# Patient Record
Sex: Female | Born: 1990 | Race: White | Hispanic: No | Marital: Single | Smoking: Never smoker
Health system: Southern US, Community
[De-identification: ages and names within clinical notes are randomized; demographics above are authoritative.]

## PROBLEM LIST (undated history)

## (undated) DIAGNOSIS — F79 Unspecified intellectual disabilities: Secondary | ICD-10-CM

## (undated) DIAGNOSIS — F319 Bipolar disorder, unspecified: Secondary | ICD-10-CM

## (undated) DIAGNOSIS — Q8719 Other congenital malformation syndromes predominantly associated with short stature: Secondary | ICD-10-CM

## (undated) HISTORY — DX: Other congenital malformation syndromes predominantly associated with short stature: Q87.19

---
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
## 2008-12-15 ENCOUNTER — Emergency Department (HOSPITAL_COMMUNITY): Admission: EM | Admit: 2008-12-15 | Discharge: 2008-12-15 | Payer: Self-pay | Admitting: Emergency Medicine

## 2008-12-22 ENCOUNTER — Inpatient Hospital Stay (HOSPITAL_COMMUNITY): Admission: EM | Admit: 2008-12-22 | Discharge: 2008-12-24 | Payer: Self-pay | Admitting: Emergency Medicine

## 2008-12-24 ENCOUNTER — Ambulatory Visit: Payer: Self-pay | Admitting: *Deleted

## 2009-10-05 ENCOUNTER — Emergency Department (HOSPITAL_COMMUNITY): Admission: EM | Admit: 2009-10-05 | Discharge: 2009-10-12 | Payer: Self-pay | Admitting: Emergency Medicine

## 2009-10-06 ENCOUNTER — Ambulatory Visit: Payer: Self-pay | Admitting: Psychiatry

## 2009-10-10 ENCOUNTER — Ambulatory Visit: Payer: Self-pay | Admitting: Psychiatry

## 2009-10-11 ENCOUNTER — Ambulatory Visit: Payer: Self-pay | Admitting: Psychiatry

## 2010-03-26 ENCOUNTER — Emergency Department (HOSPITAL_COMMUNITY): Admission: EM | Admit: 2010-03-26 | Discharge: 2010-03-26 | Payer: Self-pay | Admitting: Emergency Medicine

## 2010-08-05 LAB — URINALYSIS, ROUTINE W REFLEX MICROSCOPIC
Glucose, UA: NEGATIVE mg/dL
Hgb urine dipstick: NEGATIVE
Ketones, ur: NEGATIVE mg/dL
Nitrite: NEGATIVE
Protein, ur: NEGATIVE mg/dL
Specific Gravity, Urine: 1.028 (ref 1.005–1.030)
Urobilinogen, UA: 0.2 mg/dL (ref 0.0–1.0)
pH: 6 (ref 5.0–8.0)

## 2010-08-05 LAB — DIFFERENTIAL
Eosinophils Absolute: 0 10*3/uL (ref 0.0–0.7)
Lymphs Abs: 2.4 10*3/uL (ref 0.7–4.0)
Monocytes Absolute: 0.7 10*3/uL (ref 0.1–1.0)
Monocytes Relative: 9 % (ref 3–12)
Neutrophils Relative %: 56 % (ref 43–77)

## 2010-08-05 LAB — URINE MICROSCOPIC-ADD ON

## 2010-08-05 LAB — BASIC METABOLIC PANEL
CO2: 21 mEq/L (ref 19–32)
Chloride: 111 mEq/L (ref 96–112)
Creatinine, Ser: 0.89 mg/dL (ref 0.4–1.2)
GFR calc Af Amer: >60 mL/min (ref >60)
Potassium: 2.8 mEq/L — ABNORMAL LOW (ref 3.5–5.1)
Sodium: 141 mEq/L (ref 135–145)

## 2010-08-05 LAB — POCT I-STAT, CHEM 8
Calcium, Ion: 1.14 mmol/L (ref 1.12–1.32)
Creatinine, Ser: 0.7 mg/dL (ref 0.4–1.2)
Glucose, Bld: 101 mg/dL — ABNORMAL HIGH (ref 70–99)
Hemoglobin: 12.9 g/dL (ref 12.0–15.0)
Potassium: 3.4 mEq/L — ABNORMAL LOW (ref 3.5–5.1)

## 2010-08-05 LAB — CBC
HCT: 37.8 % (ref 36.0–46.0)
Hemoglobin: 12.9 g/dL (ref 12.0–15.0)
MCV: 88.7 fL (ref 78.0–100.0)
RBC: 4.27 MIL/uL (ref 3.87–5.11)
WBC: 7.3 10*3/uL (ref 4.0–10.5)

## 2010-08-05 LAB — URINE CULTURE: Colony Count: 60000

## 2010-08-05 LAB — RAPID URINE DRUG SCREEN, HOSP PERFORMED: Barbiturates: NOT DETECTED

## 2010-08-05 LAB — ETHANOL: Alcohol, Ethyl (B): <5 mg/dL (ref 0–10)

## 2010-08-24 LAB — DIFFERENTIAL
Lymphocytes Relative: 19 % (ref 12–46)
Lymphs Abs: 1.8 10*3/uL (ref 0.7–4.0)
Monocytes Relative: 9 % (ref 3–12)
Neutro Abs: 6.7 10*3/uL (ref 1.7–7.7)
Neutrophils Relative %: 71 % (ref 43–77)

## 2010-08-24 LAB — CBC
HCT: 38 % (ref 36.0–46.0)
Hemoglobin: 12.6 g/dL (ref 12.0–15.0)
Hemoglobin: 13.1 g/dL (ref 12.0–15.0)
MCHC: 33.2 g/dL (ref 30.0–36.0)
MCHC: 33.6 g/dL (ref 30.0–36.0)
MCV: 87.5 fL (ref 78.0–100.0)
RBC: 4.46 MIL/uL (ref 3.87–5.11)
RDW: 13.6 % (ref 11.5–15.5)

## 2010-08-24 LAB — COMPREHENSIVE METABOLIC PANEL
BUN: 8 mg/dL (ref 6–23)
Calcium: 9.3 mg/dL (ref 8.4–10.5)
Glucose, Bld: 106 mg/dL — ABNORMAL HIGH (ref 70–99)
Total Protein: 7.7 g/dL (ref 6.0–8.3)

## 2010-08-24 LAB — BASIC METABOLIC PANEL
BUN: 7 mg/dL (ref 6–23)
Chloride: 106 mEq/L (ref 96–112)
Glucose, Bld: 99 mg/dL (ref 70–99)
Potassium: 3.3 mEq/L — ABNORMAL LOW (ref 3.5–5.1)

## 2010-08-24 LAB — TSH: TSH: 2.042 u[IU]/mL (ref 0.700–6.400)

## 2010-08-25 LAB — CBC
MCHC: 33.5 g/dL (ref 30.0–36.0)
Platelets: 204 10*3/uL (ref 150–400)
RBC: 4.43 MIL/uL (ref 3.87–5.11)
RDW: 13.6 % (ref 11.5–15.5)

## 2010-08-25 LAB — DIFFERENTIAL
Basophils Absolute: 0 10*3/uL (ref 0.0–0.1)
Basophils Relative: 0 % (ref 0–1)
Monocytes Relative: 9 % (ref 3–12)
Neutro Abs: 4.7 10*3/uL (ref 1.7–7.7)
Neutrophils Relative %: 54 % (ref 43–77)

## 2010-08-25 LAB — RAPID URINE DRUG SCREEN, HOSP PERFORMED
Amphetamines: NOT DETECTED
Barbiturates: NOT DETECTED
Benzodiazepines: POSITIVE — AB

## 2010-08-25 LAB — BASIC METABOLIC PANEL
BUN: 11 mg/dL (ref 6–23)
CO2: 24 mEq/L (ref 19–32)
Calcium: 9.4 mg/dL (ref 8.4–10.5)
Creatinine, Ser: 0.89 mg/dL (ref 0.4–1.2)
GFR calc Af Amer: >60 mL/min (ref >60)
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
## 2013-01-31 ENCOUNTER — Encounter: Payer: Self-pay | Admitting: *Deleted

## 2013-02-04 ENCOUNTER — Encounter: Payer: Self-pay | Admitting: Obstetrics and Gynecology

## 2013-03-18 ENCOUNTER — Encounter: Payer: Self-pay | Admitting: Obstetrics & Gynecology

## 2013-04-07 ENCOUNTER — Encounter (HOSPITAL_BASED_OUTPATIENT_CLINIC_OR_DEPARTMENT_OTHER): Payer: Self-pay | Admitting: Emergency Medicine

## 2013-04-07 ENCOUNTER — Emergency Department (HOSPITAL_BASED_OUTPATIENT_CLINIC_OR_DEPARTMENT_OTHER)
Admission: EM | Admit: 2013-04-07 | Discharge: 2013-04-07 | Disposition: A | Discharge status: Home / Self Care | Payer: Medicaid Other | Attending: Emergency Medicine | Admitting: Emergency Medicine

## 2013-04-07 DIAGNOSIS — S300XXA Contusion of lower back and pelvis, initial encounter: Secondary | ICD-10-CM | POA: Insufficient documentation

## 2013-04-07 DIAGNOSIS — T148XXA Other injury of unspecified body region, initial encounter: Secondary | ICD-10-CM

## 2013-04-07 DIAGNOSIS — Z79899 Other long term (current) drug therapy: Secondary | ICD-10-CM | POA: Insufficient documentation

## 2013-04-07 NOTE — ED Notes (Signed)
SANE nurse, Jeris Penta, notified at [phone]. She is leaving [facility] at this time to come here.

## 2013-04-07 NOTE — ED Notes (Addendum)
[employer] [place] officer at bedside at this time.
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
## 2013-04-07 NOTE — ED Notes (Signed)
abd and back pain  Seen here for same x 11/2 weeks ago for same.  Prescribed keflex     No better

## 2013-04-07 NOTE — ED Provider Notes (Signed)
Medical screening examination/treatment/procedure(s) were performed by non-physician practitioner and as supervising physician I was immediately available for consultation/collaboration.  EKG Interpretation   None         Glynn Octave, MD 04/07/13 [phone]
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
## 2013-04-07 NOTE — ED Notes (Signed)
SANE nurse, Princella Ion at bedside.

## 2013-04-15 ENCOUNTER — Encounter (HOSPITAL_COMMUNITY): Payer: Self-pay | Admitting: Emergency Medicine

## 2013-04-15 ENCOUNTER — Emergency Department (HOSPITAL_COMMUNITY)
Admission: EM | Admit: 2013-04-15 | Discharge: 2013-04-17 | Disposition: A | Discharge status: Home / Self Care | Payer: Medicaid Other | Attending: Emergency Medicine | Admitting: Emergency Medicine

## 2013-04-15 DIAGNOSIS — F4310 Post-traumatic stress disorder, unspecified: Secondary | ICD-10-CM | POA: Diagnosis present

## 2013-04-15 DIAGNOSIS — F73 Profound intellectual disabilities: Secondary | ICD-10-CM

## 2013-04-15 DIAGNOSIS — F79 Unspecified intellectual disabilities: Secondary | ICD-10-CM | POA: Insufficient documentation

## 2013-04-15 DIAGNOSIS — R4689 Other symptoms and signs involving appearance and behavior: Secondary | ICD-10-CM | POA: Diagnosis present

## 2013-04-15 DIAGNOSIS — F639 Impulse disorder, unspecified: Secondary | ICD-10-CM | POA: Insufficient documentation

## 2013-04-15 DIAGNOSIS — R454 Irritability and anger: Secondary | ICD-10-CM | POA: Diagnosis present

## 2013-04-15 DIAGNOSIS — Z79899 Other long term (current) drug therapy: Secondary | ICD-10-CM | POA: Insufficient documentation

## 2013-04-15 DIAGNOSIS — Z3202 Encounter for pregnancy test, result negative: Secondary | ICD-10-CM | POA: Insufficient documentation

## 2013-04-15 HISTORY — DX: Unspecified intellectual disabilities: F79

## 2013-04-15 LAB — CBC WITH DIFFERENTIAL/PLATELET
Basophils Absolute: 0 10*3/uL (ref 0.0–0.1)
Basophils Relative: 0 % (ref 0–1)
Hemoglobin: 13.4 g/dL (ref 12.0–15.0)
Lymphocytes Relative: 30 % (ref 12–46)
Lymphs Abs: 3.1 10*3/uL (ref 0.7–4.0)
MCH: 31.7 pg (ref 26.0–34.0)
MCHC: 34.1 g/dL (ref 30.0–36.0)
Monocytes Absolute: 0.9 10*3/uL (ref 0.1–1.0)
Neutro Abs: 6.1 10*3/uL (ref 1.7–7.7)
Neutrophils Relative %: 60 % (ref 43–77)
Platelets: 225 10*3/uL (ref 150–400)
RDW: 12.5 % (ref 11.5–15.5)
WBC: 10.2 10*3/uL (ref 4.0–10.5)

## 2013-04-15 LAB — BASIC METABOLIC PANEL
BUN: 14 mg/dL (ref 6–23)
CO2: 22 mEq/L (ref 19–32)
Chloride: 102 mEq/L (ref 96–112)
Creatinine, Ser: 0.68 mg/dL (ref 0.50–1.10)
GFR calc Af Amer: >90 mL/min (ref >90)
Sodium: 137 mEq/L (ref 135–145)

## 2013-04-15 LAB — RAPID URINE DRUG SCREEN, HOSP PERFORMED
Amphetamines: NOT DETECTED
Barbiturates: NOT DETECTED
Benzodiazepines: NOT DETECTED
Cocaine: NOT DETECTED
Tetrahydrocannabinol: NOT DETECTED

## 2013-04-15 LAB — POCT PREGNANCY, URINE: Preg Test, Ur: NEGATIVE

## 2013-04-15 MED ORDER — IBUPROFEN 200 MG PO TABS: 600.0000 mg | ORAL_TABLET | Freq: Three times a day (TID) | ORAL | Status: DC | PRN

## 2013-04-15 MED ORDER — LAMOTRIGINE 200 MG PO TABS
200.0000 mg | ORAL_TABLET | Freq: Every day | ORAL | Status: DC
  Administered 2013-04-16 – 2013-04-17 (×2): 200 mg via ORAL
  Filled 2013-04-15 (×2): qty 1

## 2013-04-15 MED ORDER — PANTOPRAZOLE SODIUM 40 MG PO TBEC
40.0000 mg | DELAYED_RELEASE_TABLET | Freq: Every day | ORAL | Status: DC
  Administered 2013-04-15 – 2013-04-17 (×3): 40 mg via ORAL
  Filled 2013-04-15 (×3): qty 1

## 2013-04-15 MED ORDER — ONDANSETRON HCL 4 MG PO TABS: 4.0000 mg | ORAL_TABLET | Freq: Three times a day (TID) | ORAL | Status: DC | PRN

## 2013-04-15 MED ORDER — TRAZODONE HCL 50 MG PO TABS: 50.0000 mg | ORAL_TABLET | Freq: Two times a day (BID) | ORAL | Status: DC | PRN

## 2013-04-15 MED ORDER — OLANZAPINE 10 MG PO TABS
20.0000 mg | ORAL_TABLET | Freq: Two times a day (BID) | ORAL | Status: DC
  Administered 2013-04-15 – 2013-04-17 (×5): 20 mg via ORAL
  Filled 2013-04-15 (×5): qty 2

## 2013-04-15 MED ORDER — ACETAMINOPHEN 325 MG PO TABS: 650.0000 mg | ORAL_TABLET | ORAL | Status: DC | PRN

## 2013-04-15 MED ORDER — TRAZODONE HCL 100 MG PO TABS
100.0000 mg | ORAL_TABLET | Freq: Every day | ORAL | Status: DC
  Administered 2013-04-15 – 2013-04-16 (×2): 100 mg via ORAL
  Filled 2013-04-15 (×2): qty 1

## 2013-04-15 MED ORDER — DIVALPROEX SODIUM 500 MG PO DR TAB
500.0000 mg | DELAYED_RELEASE_TABLET | Freq: Every day | ORAL | Status: DC
  Administered 2013-04-15 – 2013-04-16 (×2): 500 mg via ORAL
  Filled 2013-04-15 (×2): qty 1

## 2013-04-15 MED ORDER — SERTRALINE HCL 50 MG PO TABS
200.0000 mg | ORAL_TABLET | Freq: Every day | ORAL | Status: DC
  Administered 2013-04-15 – 2013-04-17 (×3): 200 mg via ORAL
  Filled 2013-04-15 (×3): qty 4

## 2013-04-15 MED ORDER — LORATADINE 10 MG PO TABS
10.0000 mg | ORAL_TABLET | Freq: Every day | ORAL | Status: DC
  Administered 2013-04-16 – 2013-04-17 (×2): 10 mg via ORAL
  Filled 2013-04-15 (×2): qty 1
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
## 2013-04-15 NOTE — ED Notes (Signed)
Pt belongings: sports bra, striped shirt, black pants, black boots, white coat, socks in locker 28.

## 2013-04-15 NOTE — ED Notes (Signed)
Patient easily agitated if you get in her personal space

## 2013-04-15 NOTE — BH Assessment (Signed)
TA requested on this patient. Writer contacted SW-Shenita to inform her that this patient needed a TA. Shenita sts that she will complete a face to face assessment with this patient. Informed Shenita that TTS is available if assistance is needed.

## 2013-04-15 NOTE — ED Notes (Signed)
Bed: WA15 Expected date:  Expected time:  Means of arrival:  Comments: Hold for triage 4 
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
## 2013-04-16 DIAGNOSIS — F603 Borderline personality disorder: Secondary | ICD-10-CM

## 2013-04-16 DIAGNOSIS — R4689 Other symptoms and signs involving appearance and behavior: Secondary | ICD-10-CM | POA: Diagnosis present

## 2013-04-16 DIAGNOSIS — F79 Unspecified intellectual disabilities: Secondary | ICD-10-CM | POA: Diagnosis present

## 2013-04-16 DIAGNOSIS — R454 Irritability and anger: Secondary | ICD-10-CM

## 2013-04-16 DIAGNOSIS — F73 Profound intellectual disabilities: Secondary | ICD-10-CM

## 2013-04-16 DIAGNOSIS — F639 Impulse disorder, unspecified: Secondary | ICD-10-CM

## 2013-04-16 DIAGNOSIS — F4310 Post-traumatic stress disorder, unspecified: Secondary | ICD-10-CM

## 2013-04-16 LAB — VALPROIC ACID LEVEL: Valproic Acid Lvl: 36.2 ug/mL — ABNORMAL LOW (ref 50.0–100.0)

## 2013-04-16 LAB — HEPATIC FUNCTION PANEL
AST: 21 U/L (ref 0–37)
Albumin: 3.8 g/dL (ref 3.5–5.2)
Bilirubin, Direct: <0.1 mg/dL (ref 0.0–0.3)
Total Bilirubin: 0.3 mg/dL (ref 0.3–1.2)

## 2013-04-16 NOTE — ED Notes (Signed)
Pt mother at bedside
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
## 2013-04-16 NOTE — Progress Notes (Addendum)
Per MD and CSW assessments, pt appropriate for inpatient psych stay at hospital that can meet pt's MR needs [facility], [facility], etc.).   Per TTS Bobby, TTS [facility] can begin search for appropriate hospitals. We need 3 declines before we can work on a [place]/[facility] application. CSW available for consult and assistance. CSW working with family to get documentation to hospital of I/DD dx, and will look into [place] referral process.  York Spaniel [place], [phone]     ED CSW  2:15pm

## 2013-04-16 NOTE — Consult Note (Signed)
Pt seen with PA-C. Agree with above assessment and plan.
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
## 2013-04-16 NOTE — ED Notes (Signed)
Pt taken to TCU with sitter and security to take a shower.
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
## 2013-04-17 NOTE — ED Notes (Signed)
Pt mother belongings returned to her before leaving
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
## 2013-04-17 NOTE — Progress Notes (Signed)
CSW faxed documentation of pt's IQ and I/DD to [facility]. Shelly Rubenstein confirmed that fax was received and she will help with faxing out to 3 facilities. CSW will work on [facility] referral.  Per Precious at [facility], [phone], is pt's I/DD care coordinator.
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
## 2013-04-17 NOTE — ED Notes (Addendum)
Pt has a refuse to take a shower
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
## 2013-04-17 NOTE — ED Notes (Signed)
Patient ate 75 % of her lunch .

## 2013-04-17 NOTE — Progress Notes (Addendum)
[place] [facility] Powell in to speak with mother.  York Spaniel [place], [phone]     ED CSW  11:52am ________ Pollie Friar from [facility] evaluated patient. She does not recommend pt for inpatient psych--states that patient will not meet diversion criteria, and would not be appropriate for the psych care at [facility] anyway. Please halt all psych inpatient search.  Pt's mother told CSW at 11:30am that she is en route hospital  Mother. Di Kindle will speak to mother and update her on dispo options if mother gets here in time.  Lowell Guitar is recommending a search for another group home. Pt has an I/DD care coordinator, Domingo Madeira, who needs to initiate and facilitate this placement search. Di Kindle also recommends a respite stay at [place], but Domingo Madeira also must initiate the application for pt to be placed on waitlist.  CSW will update as situation evolves.  York Spaniel [place], [phone]     ED CSW  11:36am
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
## 2013-04-17 NOTE — Progress Notes (Signed)
Laurel from [facility] here to evaluate pt so that we can proceed with placement process. CSW will update notes as [place] [facility] assessment/situation develops.  York Spaniel [place], [phone]     ED CSW  10:33am

## 2013-04-17 NOTE — ED Notes (Signed)
Pt mother called and states that she will come to visit pt later today. Pt mother given report on how pt is doing today

## 2014-02-03 ENCOUNTER — Emergency Department (HOSPITAL_COMMUNITY)
Admission: EM | Admit: 2014-02-03 | Discharge: 2014-02-03 | Disposition: A | Discharge status: Home / Self Care | Payer: Medicaid Other | Attending: Emergency Medicine | Admitting: Emergency Medicine

## 2014-02-03 ENCOUNTER — Encounter (HOSPITAL_COMMUNITY): Payer: Self-pay | Admitting: Emergency Medicine

## 2014-02-03 DIAGNOSIS — R55 Syncope and collapse: Secondary | ICD-10-CM | POA: Diagnosis present

## 2014-02-03 DIAGNOSIS — Z3202 Encounter for pregnancy test, result negative: Secondary | ICD-10-CM | POA: Insufficient documentation

## 2014-02-03 DIAGNOSIS — IMO0002 Reserved for concepts with insufficient information to code with codable children: Secondary | ICD-10-CM | POA: Diagnosis not present

## 2014-02-03 DIAGNOSIS — F79 Unspecified intellectual disabilities: Secondary | ICD-10-CM | POA: Insufficient documentation

## 2014-02-03 DIAGNOSIS — Z79899 Other long term (current) drug therapy: Secondary | ICD-10-CM | POA: Insufficient documentation

## 2014-02-03 DIAGNOSIS — R109 Unspecified abdominal pain: Secondary | ICD-10-CM | POA: Diagnosis not present

## 2014-02-03 LAB — CBC WITH DIFFERENTIAL/PLATELET
Basophils Absolute: 0 10*3/uL (ref 0.0–0.1)
Basophils Relative: 0 % (ref 0–1)
EOS ABS: 0.1 10*3/uL (ref 0.0–0.7)
EOS PCT: 1 % (ref 0–5)
HCT: 40.8 % (ref 36.0–46.0)
HEMOGLOBIN: 13.7 g/dL (ref 12.0–15.0)
LYMPHS ABS: 2.8 10*3/uL (ref 0.7–4.0)
LYMPHS PCT: 29 % (ref 12–46)
MCH: 31 pg (ref 26.0–34.0)
MCHC: 33.6 g/dL (ref 30.0–36.0)
MCV: 92.3 fL (ref 78.0–100.0)
Monocytes Absolute: 1.1 10*3/uL — ABNORMAL HIGH (ref 0.1–1.0)
Monocytes Relative: 11 % (ref 3–12)
Neutro Abs: 5.8 10*3/uL (ref 1.7–7.7)
Neutrophils Relative %: 59 % (ref 43–77)
Platelets: 181 10*3/uL (ref 150–400)
RBC: 4.42 MIL/uL (ref 3.87–5.11)
RDW: 11.6 % (ref 11.5–15.5)
WBC: 9.7 10*3/uL (ref 4.0–10.5)

## 2014-02-03 LAB — COMPREHENSIVE METABOLIC PANEL WITH GFR
ALT: 54 U/L — ABNORMAL HIGH (ref 0–35)
AST: 41 U/L — ABNORMAL HIGH (ref 0–37)
Albumin: 3.6 g/dL (ref 3.5–5.2)
Alkaline Phosphatase: 66 U/L (ref 39–117)
Anion gap: 15 (ref 5–15)
BUN: 10 mg/dL (ref 6–23)
CO2: 23 meq/L (ref 19–32)
Calcium: 9.1 mg/dL (ref 8.4–10.5)
Chloride: 105 meq/L (ref 96–112)
Creatinine, Ser: 0.71 mg/dL (ref 0.50–1.10)
GFR calc Af Amer: >90 mL/min
GFR calc non Af Amer: >90 mL/min
Glucose, Bld: 120 mg/dL — ABNORMAL HIGH (ref 70–99)
Potassium: 4 meq/L (ref 3.7–5.3)
Sodium: 143 meq/L (ref 137–147)
Total Bilirubin: 0.3 mg/dL (ref 0.3–1.2)
Total Protein: 7.4 g/dL (ref 6.0–8.3)

## 2014-02-03 LAB — URINE MICROSCOPIC-ADD ON

## 2014-02-03 LAB — URINALYSIS, ROUTINE W REFLEX MICROSCOPIC
Bilirubin Urine: NEGATIVE
Glucose, UA: NEGATIVE mg/dL
Hgb urine dipstick: NEGATIVE
Ketones, ur: NEGATIVE mg/dL
Nitrite: NEGATIVE
Protein, ur: NEGATIVE mg/dL
Specific Gravity, Urine: 1.012 (ref 1.005–1.030)
Urobilinogen, UA: 1 mg/dL (ref 0.0–1.0)
pH: 7 (ref 5.0–8.0)

## 2014-02-03 LAB — POC URINE PREG, ED: Preg Test, Ur: NEGATIVE

## 2014-02-03 MED ORDER — CLONAZEPAM 0.5 MG PO TABS
0.5000 mg | ORAL_TABLET | Freq: Every day | ORAL | Status: DC
  Administered 2014-02-03: 0.5 mg via ORAL
  Filled 2014-02-03: qty 1

## 2014-02-03 NOTE — ED Notes (Signed)
Pt attempted to urinate in hat but was unable to at this time

## 2014-02-03 NOTE — ED Notes (Signed)
According to mother and group home employee, pt has no urinary complaint. No complaint of urinary burning, increased sensation

## 2014-02-03 NOTE — ED Provider Notes (Signed)
CSN: [id number]     Arrival date & time 02/03/14  1326 History   First MD Initiated Contact with Patient 02/03/14 1622     Chief Complaint  Patient presents with  . Abdominal Pain  . Near Syncope     (Consider location/radiation/quality/duration/timing/severity/associated sxs/prior Treatment) HPI Dana Mcneil is a 23 y.o. female with MR who is here for evaluation after having a syncopal event. History is given by group home staff. Staff report they were out to lunch at [place] [employer]. She reports patient was going to the bathroom with other members of the group home staff, she sat down on the toilet, passed gas and then passed out. She reports patient was passed out for less than a minute returned to consciousness spontaneously and is now back to baseline. She also reports abdominal pain but cannot characterize the discomfort. Denies nausea vomiting, did not hit her head.  Past Medical History  Diagnosis Date  . MR (mental retardation)    History reviewed. No pertinent past surgical history. No family history on file. History  Substance Use Topics  . Smoking status: Never Smoker   . Smokeless tobacco: Not on file  . Alcohol Use: No   OB History   Grav Para Term Preterm Abortions TAB SAB Ect Mult Living                 Review of Systems  Constitutional: Negative for fever.  HENT: Negative for sore throat.   Eyes: Negative for visual disturbance.  Respiratory: Negative for shortness of breath.   Cardiovascular: Negative for chest pain.  Gastrointestinal: Positive for abdominal pain.  Endocrine: Negative for polyuria.  Genitourinary: Negative for dysuria.  Skin: Negative for rash.  Neurological: Negative for headaches.      Allergies  Haldol and Lorazepam  Home Medications   Prior to Admission medications   Medication Sig Start Date End Date Taking? Authorizing Provider  benztropine (COGENTIN) 0.5 MG tablet Take 0.5 mg by mouth 2 (two) times daily.   Yes Historical  Provider, MD  cetirizine (ZYRTEC) 10 MG tablet Take 10 mg by mouth daily.   Yes Historical Provider, MD  clonazePAM (KLONOPIN) 0.5 MG tablet Take 0.5 mg by mouth 3 (three) times daily.   Yes Historical Provider, MD  divalproex (DEPAKOTE) 250 MG DR tablet Take 250 mg by mouth 3 (three) times daily.   Yes Historical Provider, MD  fluticasone (FLONASE) 50 MCG/ACT nasal spray Place 2 sprays into both nostrils daily.   Yes Historical Provider, MD  OLANZapine (ZYPREXA) 20 MG tablet Take 20 mg by mouth daily.   Yes Historical Provider, MD  traZODone (DESYREL) 50 MG tablet Take 50 mg by mouth at bedtime.   Yes Historical Provider, MD   BP 116/76  Pulse 99  Temp(Src) 98.2 F (36.8 C) (Oral)  Resp 32  SpO2 100% Physical Exam  Nursing note and vitals reviewed. Constitutional: No distress.  Awake, alert, nontoxic appearance with baseline speech for patient.  HENT:  Head: Atraumatic.  Mouth/Throat: No oropharyngeal exudate.  Eyes: EOM are normal. Pupils are equal, round, and reactive to light. Right eye exhibits no discharge. Left eye exhibits no discharge.  Neck: Neck supple.  Cardiovascular: Normal rate and regular rhythm.   No murmur heard. Pulmonary/Chest: Effort normal and breath sounds normal. No stridor. No respiratory distress. She has no wheezes. She has no rales. She exhibits no tenderness.  Abdominal: Soft. Bowel sounds are normal. She exhibits no mass. There is no tenderness. There is  no rebound and no guarding.  Patient exhibits no abdominal tenderness upon palpation. Is laughing during exam. Shows no signs of acute distress or discomfort.  Musculoskeletal: She exhibits no tenderness.  Baseline ROM, moves extremities with no obvious new focal weakness.  Lymphadenopathy:    She has no cervical adenopathy.  Neurological:  Awake, alert, cooperative and aware of situation; motor strength bilaterally; sensation normal to light touch bilaterally; peripheral visual fields full to  confrontation; no facial asymmetry; tongue midline; major cranial nerves appear intact; baseline gait without new ataxia.  Skin: No rash noted. She is not diaphoretic.  Psychiatric: She has a normal mood and affect.    ED Course  Procedures (including critical care time) Labs Review Labs Reviewed  CBC WITH DIFFERENTIAL - Abnormal; Notable for the following:    Monocytes Absolute 1.1 (*)    All other components within normal limits  COMPREHENSIVE METABOLIC PANEL - Abnormal; Notable for the following:    Glucose, Bld 120 (*)    AST 41 (*)    ALT 54 (*)    All other components within normal limits  URINALYSIS, ROUTINE W REFLEX MICROSCOPIC - Abnormal; Notable for the following:    Leukocytes, UA TRACE (*)    All other components within normal limits  URINE MICROSCOPIC-ADD ON  POC URINE PREG, ED    Imaging Review No results found.   EKG Interpretation None     Meds given in ED:  Medications - No data to display  Discharge Medication List as of 02/03/2014  5:30 PM     Filed Vitals:   02/03/14 1554 02/03/14 1735 02/03/14 1736 02/03/14 1737  BP: 137/74  116/76   Pulse: 99 99    Temp:    98.2 F (36.8 C)  TempSrc:    Oral  Resp: 32     SpO2: 100% 100%      MDM  Vitals stable - WNL -afebrile Pt resting comfortably in ED.  Klonopin given per her home regimen.Laughing and is at baseline per family and group home staff. Syncopal episode liklely vasovagal due to story and no other identifiable cause for event. PE shows no focal neuro deficits. Orthostatic Vitals appropriate. Labwork noncontributory. EKG not concerning. Pt is in good condition and is suitable for discharge. Discussed f/u with PCP and return precautions, pt very amenable to plan.   Final diagnoses:  Syncope, vasovagal  Prior to patient discharge, I discussed and reviewed this case with Dr.Allen         Sharlene Motts, PA-C 02/04/14 1211

## 2014-02-03 NOTE — ED Notes (Signed)
Per EMS: Pt lives at a Group Home due to MR. Pt was at a [place] [employer] and began to have abdominal pain and another individual who lives at the nursing home stated that she was pale. Pt states she felt weak and dizzy while attempting to ambulate to the restroom. Pt did not have a LOC or fall. EMS reports that on arrival her symptoms had improved and was A/O.

## 2014-02-03 NOTE — Discharge Instructions (Signed)
You were seen in the ED today for evaluation for syncope. Your symptoms are likely due to vasovagal syncope and not due to any other emergent cause. May follow up with your primary care as needed. Return to ED for further evaluation he began to experience fevers, lightheadedness or dizziness, numbness or weakness, difficulty breathing or chest pain   Emergency Department Resource Guide 1) Find a Doctor and Pay Out of Pocket Although you won't have to find out who is covered by your insurance plan, it is a good idea to ask around and get recommendations. You will then need to call the office and see if the doctor you have chosen will accept you as a new patient and what types of options they offer for patients who are self-pay. Some doctors offer discounts or will set up payment plans for their patients who do not have insurance, but you will need to ask so you aren't surprised when you get to your appointment.  2) Contact Your Local Health Department Not all health departments have doctors that can see patients for sick visits, but many do, so it is worth a call to see if yours does. If you don't know where your local health department is, you can check in your phone book. The CDC also has a tool to help you locate your state's health department, and many state websites also have listings of all of their local health departments.  3) Find a Walk-in Clinic If your illness is not likely to be very severe or complicated, you may want to try a walk in clinic. These are popping up all over the country in pharmacies, drugstores, and shopping centers. They're usually staffed by nurse practitioners or physician assistants that have been trained to treat common illnesses and complaints. They're usually fairly quick and inexpensive. However, if you have serious medical issues or chronic medical problems, these are probably not your best option.  No Primary Care Doctor: - Call Health Connect at  [phone] - they  can help you locate a primary care doctor that  accepts your insurance, provides certain services, etc. - Physician Referral Service- [phone]  Chronic Pain Problems: Organization         Address  Phone   Notes  [facility]  [phone] Patients need to be referred by their primary care doctor.   Medication Assistance: Organization         Address  Phone   Notes  [facility] Medication [facility] [address] [place] [place], [place] [address] [phone] --Must be a resident of [facility] -- Must have NO insurance coverage whatsoever (no Medicaid/ Medicare, etc.) -- The pt. MUST have a primary care doctor that directs their care regularly and follows them in the community   [facility]  [phone]   [employer]  [phone]    Agencies that provide inexpensive medical care: Organization         Address  Phone   Notes  [facility]  [phone]   [facility]    [phone]   [employer] [address] [place], [place] [address] [phone]   [facility] [address] [place] [place] [phone]   Planned Parenthood    [phone]   [facility]    [phone]   [facility]  [address], [place] Phone:  [phone], Fax:  [phone] Hours of Operation:  9 am - 6 pm, M-F.  Also accepts Medicaid/Medicare and self-pay.  [facility] for [place], [address], [place] Phone: [phone], Fax: [phone]. Hours of Operation:  8:30 am - 5:30 pm, M-F.  Also accepts Medicaid and self-pay.  [facility] [place] [address], [place] Phone: [phone]   [place], [place], [place] [phone], Ext. [phone] Mondays & Thursdays: 7-9 AM.  First 15 patients are seen on a first come, first serve basis.    [place] Providers:  Organization         Address  Phone   Notes  [facility] [address], [place] [phone] Also accepts self-pay patients.  [facility] [address], [place]  [phone]   [place], [address], [place] [phone]   [facility] [address] [place] [phone]   Lucianne Lei [address], [place]   [phone] Only accepts [place] Access [place] patients after they have their name applied to their card.   Self-Pay (no insurance) in [facility]:  Organization         Address  Phone   Notes  Sickle Cell Patients, [facility] Internal Medicine [place] [phone]   [facility] [place] [phone]   [facility] [place]  [place], [place], [place] [phone]   [facility]/Dr. Osei-Bonsu  [address], [place] [address], [place] [phone] Phone number for both [place] and [place] locations is the same.  [facility] [address] [place] [phone]   [facility] [address] [place] or [address] [phone]   [facility] [address], [place] [phone], phone; [phone], fax Sees patients 1st and 3rd Saturday of every month.  Must not qualify for public or private insurance (i.e. Medicaid, Medicare, [place] Health Choice, Veterans' Benefits)  Household income should be no more than 200% of the poverty level The clinic cannot treat you if you are pregnant or think you are pregnant  Sexually transmitted diseases are not treated at the clinic.    Dental Care: Organization         Address  Phone  Notes  [facility] Department of [facility] [place] [phone] Accepts children up to age 51 who are enrolled in [place] or [place]; pregnant women with a Medicaid card; and children who have applied for Medicaid or [place] Health Choice, but were declined, whose parents can pay a reduced fee at time of service.  [facility] Department of [facility]  [address], [place] [phone] Accepts children up to age 57 who are enrolled in [place] or [place]; pregnant women with a Medicaid card; and children who have applied for Medicaid or [place] Health Choice, but were declined, whose parents can pay a reduced fee at time of service.  [facility]  [place] [phone] Patients are seen by appointment only. Walk-ins are not accepted. [facility] will see patients 74 years of age and older. Monday - Tuesday (8am-5pm) Most Wednesdays (8:30-5pm) $30 per visit, cash only  [facility] Adult Dental Access PRO[facility]  [address], [facility] [phone] Patients are seen by appointment only. Walk-ins are not accepted. [place] will see patients 52 years of age and older. One Wednesday Evening (Monthly: Volunteer Based).  $30 per visit, cash only  [place]  [phone] for adults; Children under age 91, call Graduate Pediatric Dentistry at [phone]. Children aged 57-14, please call [phone] to request a pediatric application.  Dental services are provided in all areas of dental care including fillings, crowns and bridges, complete and partial dentures, implants, gum treatment, root canals, and extractions. Preventive care is also provided. Treatment is provided to both adults and children. Patients are selected via a lottery and there is often a waiting list.   [facility] [address] [place]  [phone] [email or web address]   [facility] [address] [place], [place] [phone], Ext. [phone] Second and Fourth Thursday of each month, opens at 6:30 AM; Clinic ends at 9 AM.  Patients are seen on a first-come first-served basis, and a limited number are seen during each clinic.   [facility]  [address] [place], [place] [phone]   Eligibility Requirements You must have lived in [place], [place], or [place] counties for at least the last three months.   You cannot be eligible for state or federal sponsored [employer], including [employer], [place], or [employer].   You generally cannot be eligible for healthcare insurance through your employer.    How to apply: Eligibility screenings are held every Tuesday and Wednesday afternoon from 1:00 pm until 4:00 pm. You do not need an appointment for the interview!  [facility] [address] [place], [place]   [place]  [place] Department  [place]  [phone]    Behavioral Health Resources in the Community: Intensive Outpatient Programs Organization         Address  Phone  Notes  [facility] [place]. [address] [place], [place] [phone]   [facility] Outpatient [address], [place], [place]   [facility]: [facility] [address], [place], [place]   [place] [address]  [place], [place] or [phone]   Substance Abuse Resources Organization         Address  Phone  Notes  [facility] Drug Services  [phone]   [place]  [phone]   [facility] [place]   Chinita Pester  [phone]   Residential & Outpatient Substance Abuse Program  [phone]   Psychological Services Organization         Address  Phone  Notes  [facility] [place]   [place]  [phone]   [place] [address], [place] or [phone]    Mobile Crisis Teams Organization         Address  Phone  Notes  [facility], Mobile Crisis Care Unit  [phone]   [facility]  [address] [place], [place]   [facility] [address] [place] [phone]    Self-Help/Support Groups Organization  Address  Phone             Notes  °[facility] [place] - variety of support groups  336- [phone] Call for more information  °Narcotics Anonymous (NA), [facility] [address], °[place]  2 meetings at this location  ° °Residential Treatment Programs °Organization         Address  Phone  Notes  °[facility] [address],    °[place]  1-866-801-8205   °[facility] ° [address], [place], [place] [phone]   °[facility] [address], [place] [phone] Admissions: 8am-3pm M-F  °[facility] [address]    °[place], [place] [phone]   °[facility] [address], [place], [place] [phone]   °[facility] [address]  °[place], [place] [phone]   °[facility] - Intensive Outpatient [address], [place], [place] [phone]   °[facility] [address]  °[place], [place] 1-877-615-2722 or [phone]   °[facility] ([facility]) [address] [place], [place] [phone] Accepts Medicaid  °[facility] [address]  °[place] 1-800-659-3381 Substance Abuse/Addiction Treatment  ° °[place] Behavioral Health Resources °Organization         Address  Phone  Notes  °[facility]  (888) 581-9988   °Julie Brannon, PhD [address] [place], [place]   (336) 349-5553 or  (336) 951-0000   ° Behavioral   [address] °[place], [place] (336) 349-4454   °[facility] [address], [place], [place] (336) [phone] Insurance/Medicaid/sponsorship through [facility]  °[facility] [address]                                    [place], [place] (336) [phone] Therapy/tele-psych/case  °[facility] [address]  ° [place], [place] (336) [phone]    °Dr. Arfeen  (336) [phone]   °[facility] [place]  [employer] [place] [facility] 1) [address],  °2) [address], [place] °3)  [address], [place] (336) [phone] °(336) [phone] ° °(336) 342-8140   °[place] Child Abuse Hotline (336) 342-1394 or (336) 342-3537 (After Hours)    ° ° ° °

## 2014-02-03 NOTE — ED Provider Notes (Signed)
Medical screening examination/treatment/procedure(s) were conducted as a shared visit with non-physician practitioner(s) and myself.  I personally evaluated the patient during the encounter.   EKG Interpretation None     Pt here after having a likely vagal episode while sitting on the toilet- physical exam at baseline-no reported seizure activity--lab work reassuring, stable for d/c  Toy Baker, MD 02/03/14 1624

## 2014-02-04 NOTE — ED Provider Notes (Signed)
Medical screening examination/treatment/procedure(s) were conducted as a shared visit with non-physician practitioner(s) and myself.  I personally evaluated the patient during the encounter.   EKG Interpretation None       Toy Baker, MD 02/04/14 [phone]

## 2014-03-01 ENCOUNTER — Encounter: Payer: Self-pay | Admitting: Gastroenterology

## 2014-03-06 ENCOUNTER — Encounter: Payer: Self-pay | Admitting: Gastroenterology

## 2014-04-24 ENCOUNTER — Ambulatory Visit (INDEPENDENT_AMBULATORY_CARE_PROVIDER_SITE_OTHER): Payer: Medicaid Other | Admitting: Gastroenterology

## 2014-04-24 ENCOUNTER — Encounter: Payer: Self-pay | Admitting: Gastroenterology

## 2014-04-24 VITALS — BP 108/60 | HR 64 | Ht 63.0 in | Wt 138.5 lb

## 2014-04-24 DIAGNOSIS — R1310 Dysphagia, unspecified: Secondary | ICD-10-CM

## 2014-04-24 NOTE — Progress Notes (Signed)
_                                                                                                                History of Present Illness:  Dana Mcneil is a 23 year old white female with mental retardation referred for evaluation of swallowing difficulties.  In 2009 she underwent upper endoscopy because of a question of dysphagia.  Exam was unremarkable.  Colonoscopy also was normal.  Periodically she has had choking episodes with swallowing but symptoms have worsened over last 2 months.  She will choke immediately upon swallowing liquids or solids.  Her mother states that she appears to make multiple attempts at swallowing.  History was obtained through her mother.  The patient is nonverbal.   Past Medical History  Diagnosis Date  . MR (mental retardation)    History reviewed. No pertinent past surgical history. family history includes Diabetes in her father. There is no history of Colon cancer, Colon polyps, Kidney disease, or Esophageal cancer. Current Outpatient Prescriptions  Medication Sig Dispense Refill  . benztropine (COGENTIN) 0.5 MG tablet Take 0.5 mg by mouth 2 (two) times daily.    . cetirizine (ZYRTEC) 10 MG tablet Take 10 mg by mouth daily.    . clonazePAM (KLONOPIN) 0.5 MG tablet Take 0.5 mg by mouth 3 (three) times daily.    . divalproex (DEPAKOTE) 250 MG DR tablet Take 250 mg by mouth 3 (three) times daily.    . fluticasone (FLONASE) 50 MCG/ACT nasal spray Place 2 sprays into both nostrils daily.    Marland Kitchen. OLANZapine (ZYPREXA) 20 MG tablet Take 20 mg by mouth daily.    . traZODone (DESYREL) 50 MG tablet Take 50 mg by mouth at bedtime.     No current facility-administered medications for this visit.   Allergies as of 04/24/2014 - Review Complete 04/24/2014  Allergen Reaction Noted  . Haldol [haloperidol lactate]  04/15/2013  . Lorazepam  10/20/2007    reports that she has never smoked. She has never used smokeless tobacco. She reports that she does not  drink alcohol or use illicit drugs.   Review of Systems: Pertinent positive and negative review of systems were noted in the above HPI section. All other review of systems were otherwise negative.  Vital signs were reviewed in today's medical record Physical Exam: General: Well developed , well nourished, no acute distress Skin: anicteric Head: Normocephalic and atraumatic Eyes:  sclerae anicteric, EOMI Ears: Normal auditory acuity Mouth: No deformity or lesions Neck: Supple, no masses or thyromegaly Lungs: Clear throughout to auscultation Heart: Regular rate and rhythm; no murmurs, rubs or bruits Abdomen: Soft, non tender and non distended. No masses, hepatosplenomegaly or hernias noted. Normal Bowel sounds Rectal:deferred Musculoskeletal: Symmetrical with no gross deformities  Skin: No lesions on visible extremities Pulses:  Normal pulses noted Extremities: No clubbing, cyanosis, edema or deformities noted Neurological: Alert oriented x 4, grossly nonfocal Cervical Nodes:  No significant cervical adenopathy Inguinal Nodes: No significant inguinal adenopathy Psychological:  Alert and cooperative. Normal mood  and affect  See Assessment and Plan under Problem List

## 2014-04-24 NOTE — Patient Instructions (Addendum)
You have been scheduled for a modified barium swallow on [facility]. Please arrive 15 minutes prior to your test for registration. [facility] for your appointment. Please refrain from eating or drinking anything 4 hours prior to your test. Should you need to cancel or reschedule your appointment, please contact [phone] [facility]([place]) or [phone] [facility]([place]). _____________________________________________________________________ A Modified Barium Swallow Study, or MBS, is a special x-ray that is taken to check swallowing skills. It is carried out by a Marine scientistadiologist and a Warehouse managerpeech Language Pathologist (SLP). During this test, yourmouth, throat, and esophagus, a muscular tube which connects your mouth to your stomach, is checked. The test will help you, your doctor, and the SLP plan what types of foods and liquids are easier for you to swallow. The SLP will also identify positions and ways to help you swallow more easily and safely. What will happen during an MBS? You will be taken to an x-ray room and seated comfortably. You will be asked to swallow small amounts of food and liquid mixed with barium. Barium is a liquid or paste that allows images of your mouth, throat and esophagus to be seen on x-ray. The x-ray captures moving images of the food you are swallowing as it travels from your mouth through your throat and into your esophagus. This test helps identify whether food or liquid is entering your lungs (aspiration). The test also shows which part of your mouth or throat lacks strength or coordination to move the food or liquid in the right direction. This test typically takes 30 minutes to 1 hour to complete. _______________________________________________________________________

## 2014-04-24 NOTE — Assessment & Plan Note (Signed)
I suspect that the patient's swallowing difficulties is more likely due to problems with deglutition than a primary motility disorder of the esophagus or a fixed stricture.  Accordingly, I will start with a modified barium swallowing study.  The mother is not certain that she would operate with a full barium esophagram.  I will make further recommendations pending results of the first study.

## 2014-04-26 ENCOUNTER — Other Ambulatory Visit (HOSPITAL_COMMUNITY): Payer: Self-pay | Admitting: Gastroenterology

## 2014-04-26 DIAGNOSIS — R1310 Dysphagia, unspecified: Secondary | ICD-10-CM

## 2014-05-02 ENCOUNTER — Ambulatory Visit (HOSPITAL_COMMUNITY)
Admission: RE | Admit: 2014-05-02 | Discharge: 2014-05-02 | Disposition: A | Discharge status: Home / Self Care | Payer: Medicaid Other | Source: Ambulatory Visit | Attending: Gastroenterology | Admitting: Gastroenterology

## 2014-05-02 ENCOUNTER — Telehealth: Payer: Self-pay | Admitting: Gastroenterology

## 2014-05-02 DIAGNOSIS — R1311 Dysphagia, oral phase: Secondary | ICD-10-CM | POA: Insufficient documentation

## 2014-05-02 DIAGNOSIS — R1310 Dysphagia, unspecified: Secondary | ICD-10-CM

## 2014-05-02 DIAGNOSIS — F79 Unspecified intellectual disabilities: Secondary | ICD-10-CM | POA: Diagnosis not present

## 2014-05-02 DIAGNOSIS — R05 Cough: Secondary | ICD-10-CM | POA: Insufficient documentation

## 2014-05-02 DIAGNOSIS — R1313 Dysphagia, pharyngeal phase: Secondary | ICD-10-CM | POA: Insufficient documentation

## 2014-05-02 NOTE — Procedures (Signed)
Objective Swallowing Evaluation: Modified Barium Swallowing Study  Patient Details  Name: Dana Mcneil MRN: [id number] Date of Birth: 1990/09/19  Today's Date: 05/02/2014 Time: 1105-1150 SLP Time Calculation (min) (ACUTE ONLY): 45 min  Past Medical History:  Past Medical History  Diagnosis Date  . MR (mental retardation)    Past Surgical History: No past surgical history on file. 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  Caregiver reports frequent coughing/strangling during meals which has increased in recent months. Caregiver denies pneumonia or recent illnesses.       Assessment / Plan / Recommendation Clinical Impression  Dysphagia Diagnosis: Mild oral phase dysphagia;Moderate oral phase dysphagia;Severe pharyngeal phase dysphagia Clinical impression: Prior to introduction of po's Dana Mcneil was noted to have a wet vocal quality with inability to execute effective volitional cough.  Pt exhibited a mild-moderate oral dysphagia with weak lingual manipulation and mildly delayed transit (mom reports frequent pocketed food).  Pharyngeal dysphagia was severe and characterized by decreased laryngeal elevation leading to frank aspiration during the swallow with thin (90% silent; one ineffective and significantly weak reflexive cough).  Larygneal penetration to vocal cords (silent) after the swallow from mod-maximum pyriform sinus residue. Therapeutic intervention included tactile cues and "dry spoon" trials that were ineffective in eliciting a second swallow, therefore unable to significantly reduce or clear pharyngeal residue. Volitional attempts to elicit throat clear and cough are significantly weak and ineffective.  Decrease in pharyngeal residue post thin and nectar trials was insignificant and nectar thick liquids recommended with a mechanical soft texture (cut food into smaller  pieces).  Despite diet and liquid modifications pt, unfortunatley is at high aspiration risk suspect due to Cornelia Delaney syndrome.  Risk would become greatest if Dana Mcneil were to become ill with decrease mobility/endurance and overall, reserve.  Recommend attempts at 2nd swallows (dry spoon trials) and cough/throat clear, sit upright 1 hour after eating, avoid straws, pills crushed (whole in applesauce if she does not tolerate crushed) and full supervision for cues.     Treatment Recommendation  Defer treatment plan to SLP at (Comment)    Diet Recommendation Dysphagia 3 (Mechanical Soft);Nectar-thick liquid   Liquid Administration via: Cup;No straw Medication Administration: Crushed with puree Supervision: Patient able to self feed;Full supervision/cueing for compensatory strategies Compensations: Slow rate;Small sips/bites;Multiple dry swallows after each bite/sip;Clear throat after each swallow;Hard cough after swallow;Check for pocketing Postural Changes and/or Swallow Maneuvers: Seated upright 90 degrees;Upright 30-60 min after meal    Other  Recommendations Oral Care Recommendations: Oral care BID   Follow Up Recommendations  Home health SLP    Frequency and Duration        Pertinent Vitals/Pain No evidence pain         Reason for Referral Objectively evaluate swallowing function   Oral Phase Oral Preparation/Oral Phase Oral Phase: Impaired Oral - Solids Oral - Regular: Delayed oral transit;Weak lingual manipulation   Pharyngeal Phase Pharyngeal Phase Pharyngeal Phase: Impaired Pharyngeal - Nectar Pharyngeal - Nectar Cup: Delayed swallow initiation;Premature spillage to valleculae;Pharyngeal residue - pyriform sinuses;Reduced laryngeal elevation Pharyngeal - Thin Pharyngeal - Thin Cup: Pharyngeal residue - pyriform sinuses;Reduced laryngeal elevation;Penetration/Aspiration after swallow;Pharyngeal residue - valleculae;Reduced tongue base retraction;Significant aspiration  (Amount) Penetration/Aspiration details (thin cup): Material enters airway, CONTACTS cords and not ejected out;Material enters airway, passes BELOW cords without attempt by patient to eject out (silent aspiration);Material enters airway, passes BELOW cords and not ejected out despite  cough attempt by patient Pharyngeal - Thin Straw: Penetration/Aspiration after swallow;Pharyngeal residue - valleculae;Pharyngeal residue - pyriform sinuses;Reduced laryngeal elevation;Reduced tongue base retraction Penetration/Aspiration details (thin straw): Material enters airway, CONTACTS cords and not ejected out Pharyngeal - Solids Pharyngeal - Puree: Pharyngeal residue - valleculae;Pharyngeal residue - pyriform sinuses;Reduced laryngeal elevation;Reduced tongue base retraction Pharyngeal - Regular: Pharyngeal residue - pyriform sinuses;Reduced laryngeal elevation  Cervical Esophageal Phase    GO    Cervical Esophageal Phase [facility]    Functional Assessment Tool Used: clinical judgement Functional Limitations: Swallowing Swallow Current Status ([id number](G8996): At least 80 percent but less than 100 percent impaired, limited or restricted Swallow Goal Status [phone](G8997): At least 80 percent but less than 100 percent impaired, limited or restricted Swallow Discharge Status [phone](G8998): At least 80 percent but less than 100 percent impaired, limited or restricted    Royce MacadamiaLitaker, [place] Lintner Willis 05/02/2014, 2:36 PM  Breck CoonsLisa Willis Lonell FaceLitaker M.Ed [employer] Pager [phone]

## 2014-05-03 ENCOUNTER — Telehealth: Payer: Self-pay | Admitting: Gastroenterology

## 2014-05-03 ENCOUNTER — Other Ambulatory Visit: Payer: Self-pay

## 2014-05-03 NOTE — Telephone Encounter (Signed)
Pt's mother called back.  She is upset because she has not received a call back from Dr. Arlyce DiceKaplan.  "This is my child and I want a call back today. It is very unprofessional not to receive a call back from the doctor as I requested.  If he does not call back today before 5:00 I will be calling whoever is on call tonight and if need be I will come and sit in the lobby until I get to speak with Dr. Arlyce DiceKaplan.  I explained that Dr. Marzetta BoardKaplan's nurse Waynetta SandyBeth has sent a message to Dr. Arlyce DiceKaplan regarding her concerns. Pt's mother expressed that Waynetta SandyBeth is extremely nice.

## 2014-05-03 NOTE — Telephone Encounter (Signed)
Please see the report from her swallowing test. Very detailed. I need an order for the recommendation to be followed by the group home she lives in. The mother also wants to know if you would consider an EGD to rule out any stricture that could cause her symptoms. Mother is super worried and concerned.

## 2014-05-03 NOTE — Telephone Encounter (Signed)
Written order given to the mother. She will take it to the group home. Copy for the chart sent to be scanned. Explained barium swallow and mother does want to pursue this.

## 2014-05-03 NOTE — Telephone Encounter (Signed)
She needs a dysphagia 3 diet (soft foods only) and thick nectar.  If the mother insists we can do a barium swallow.  A distal stricture is less likely.

## 2014-05-03 NOTE — Telephone Encounter (Signed)
A user error has taken place.

## 2014-05-04 ENCOUNTER — Ambulatory Visit: Payer: Self-pay | Admitting: Gastroenterology

## 2014-05-04 ENCOUNTER — Other Ambulatory Visit: Payer: Self-pay

## 2014-05-04 DIAGNOSIS — T17320A Food in larynx causing asphyxiation, initial encounter: Secondary | ICD-10-CM

## 2014-05-04 NOTE — Telephone Encounter (Signed)
Dana Mcneil advised Dana Mcneil will have a barium swallow on 05/10/14 at 10:30 am .

## 2014-05-10 ENCOUNTER — Ambulatory Visit (HOSPITAL_COMMUNITY)
Admission: RE | Admit: 2014-05-10 | Discharge: 2014-05-10 | Disposition: A | Discharge status: Home / Self Care | Payer: Medicaid Other | Source: Ambulatory Visit | Attending: Gastroenterology | Admitting: Gastroenterology

## 2014-05-10 DIAGNOSIS — T17320A Food in larynx causing asphyxiation, initial encounter: Secondary | ICD-10-CM | POA: Diagnosis not present

## 2014-05-22 ENCOUNTER — Other Ambulatory Visit: Payer: Self-pay | Admitting: *Deleted

## 2014-05-22 ENCOUNTER — Encounter: Payer: Self-pay | Admitting: Gastroenterology

## 2014-05-22 ENCOUNTER — Ambulatory Visit (INDEPENDENT_AMBULATORY_CARE_PROVIDER_SITE_OTHER): Payer: Medicaid Other | Admitting: Gastroenterology

## 2014-05-22 DIAGNOSIS — R1314 Dysphagia, pharyngoesophageal phase: Secondary | ICD-10-CM

## 2014-05-22 NOTE — Progress Notes (Signed)
I had a 30 minute family friends with mother and father.  The patient was not present.  Findings from the dysphagia study and barium swallow were reviewed.  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  She's lost 30 pounds over an unspecified of time.   She lives in a group home.  The family related how behavioral issues have interfered with her day-to-day function including her nutrition.  They inquired whether she can be taught to improve her swallowing function.  I recommended that the family and patient seek consultation with a speech pathologist followed by consultation with a nutritionist.  I do not feel that there is any intervention by endoscopy that can improve her situation.

## 2014-05-25 ENCOUNTER — Emergency Department (HOSPITAL_COMMUNITY)
Admission: EM | Admit: 2014-05-25 | Discharge: 2014-05-25 | Disposition: A | Discharge status: Home / Self Care | Payer: Medicaid Other | Attending: Emergency Medicine | Admitting: Emergency Medicine

## 2014-05-25 ENCOUNTER — Encounter (HOSPITAL_COMMUNITY): Payer: Self-pay | Admitting: Emergency Medicine

## 2014-05-25 ENCOUNTER — Emergency Department (HOSPITAL_COMMUNITY): Payer: Medicaid Other

## 2014-05-25 DIAGNOSIS — S0990XA Unspecified injury of head, initial encounter: Secondary | ICD-10-CM

## 2014-05-25 DIAGNOSIS — Z7951 Long term (current) use of inhaled steroids: Secondary | ICD-10-CM | POA: Diagnosis not present

## 2014-05-25 DIAGNOSIS — F1310 Sedative, hypnotic or anxiolytic abuse, uncomplicated: Secondary | ICD-10-CM | POA: Insufficient documentation

## 2014-05-25 DIAGNOSIS — Y9289 Other specified places as the place of occurrence of the external cause: Secondary | ICD-10-CM | POA: Insufficient documentation

## 2014-05-25 DIAGNOSIS — W2209XA Striking against other stationary object, initial encounter: Secondary | ICD-10-CM | POA: Diagnosis not present

## 2014-05-25 DIAGNOSIS — R4689 Other symptoms and signs involving appearance and behavior: Secondary | ICD-10-CM

## 2014-05-25 DIAGNOSIS — Y998 Other external cause status: Secondary | ICD-10-CM | POA: Diagnosis not present

## 2014-05-25 DIAGNOSIS — F919 Conduct disorder, unspecified: Secondary | ICD-10-CM | POA: Insufficient documentation

## 2014-05-25 DIAGNOSIS — Y9389 Activity, other specified: Secondary | ICD-10-CM | POA: Insufficient documentation

## 2014-05-25 DIAGNOSIS — Z008 Encounter for other general examination: Secondary | ICD-10-CM | POA: Diagnosis present

## 2014-05-25 DIAGNOSIS — Z79899 Other long term (current) drug therapy: Secondary | ICD-10-CM | POA: Insufficient documentation

## 2014-05-25 LAB — CBC
HEMATOCRIT: 36.5 % (ref 36.0–46.0)
Hemoglobin: 12.2 g/dL (ref 12.0–15.0)
MCH: 31.5 pg (ref 26.0–34.0)
MCHC: 33.4 g/dL (ref 30.0–36.0)
MCV: 94.3 fL (ref 78.0–100.0)
PLATELETS: 189 10*3/uL (ref 150–400)
RBC: 3.87 MIL/uL (ref 3.87–5.11)
RDW: 11.9 % (ref 11.5–15.5)
WBC: 9.5 10*3/uL (ref 4.0–10.5)

## 2014-05-25 LAB — RAPID URINE DRUG SCREEN, HOSP PERFORMED
AMPHETAMINES: NOT DETECTED
Barbiturates: NOT DETECTED
Benzodiazepines: POSITIVE — AB
Cocaine: NOT DETECTED
OPIATES: NOT DETECTED
Tetrahydrocannabinol: NOT DETECTED

## 2014-05-25 LAB — COMPREHENSIVE METABOLIC PANEL
ALT: 42 U/L — AB (ref 0–35)
AST: 48 U/L — AB (ref 0–37)
Albumin: 4.1 g/dL (ref 3.5–5.2)
Alkaline Phosphatase: 68 U/L (ref 39–117)
Anion gap: 11 (ref 5–15)
BUN: 10 mg/dL (ref 6–23)
CHLORIDE: 104 meq/L (ref 96–112)
CO2: 24 mmol/L (ref 19–32)
Calcium: 8.9 mg/dL (ref 8.4–10.5)
Creatinine, Ser: 0.75 mg/dL (ref 0.50–1.10)
GFR calc Af Amer: >90 mL/min (ref >90)
GLUCOSE: 82 mg/dL (ref 70–99)
POTASSIUM: 3.3 mmol/L — AB (ref 3.5–5.1)
SODIUM: 139 mmol/L (ref 135–145)
Total Bilirubin: 0.7 mg/dL (ref 0.3–1.2)
Total Protein: 7.3 g/dL (ref 6.0–8.3)

## 2014-05-25 LAB — ETHANOL: Alcohol, Ethyl (B): <5 mg/dL (ref 0–9)

## 2014-05-25 LAB — SALICYLATE LEVEL

## 2014-05-25 LAB — ACETAMINOPHEN LEVEL: Acetaminophen (Tylenol), Serum: <10 ug/mL — ABNORMAL LOW (ref 10–30)

## 2014-05-25 LAB — VALPROIC ACID LEVEL: VALPROIC ACID LVL: 49.5 ug/mL — AB (ref 50.0–100.0)

## 2014-05-25 MED ORDER — BENZTROPINE MESYLATE 1 MG PO TABS: 0.5000 mg | ORAL_TABLET | Freq: Two times a day (BID) | ORAL | Status: DC

## 2014-05-25 MED ORDER — OLANZAPINE 10 MG PO TABS
30.0000 mg | ORAL_TABLET | Freq: Every day | ORAL | Status: DC
  Administered 2014-05-25: 30 mg via ORAL
  Filled 2014-05-25: qty 3

## 2014-05-25 MED ORDER — DIVALPROEX SODIUM 500 MG PO DR TAB: 750.0000 mg | DELAYED_RELEASE_TABLET | Freq: Every day | ORAL | Status: DC

## 2014-05-25 MED ORDER — CLONAZEPAM 0.5 MG PO TABS
1.0000 mg | ORAL_TABLET | Freq: Three times a day (TID) | ORAL | Status: DC
  Administered 2014-05-25: 1 mg via ORAL
  Filled 2014-05-25: qty 2

## 2014-05-25 MED ORDER — ZIPRASIDONE MESYLATE 20 MG IM SOLR: 10.0000 mg | Freq: Once | INTRAMUSCULAR | Status: DC

## 2014-05-25 MED ORDER — ZOLPIDEM TARTRATE 5 MG PO TABS: 5.0000 mg | ORAL_TABLET | Freq: Every evening | ORAL | Status: DC | PRN

## 2014-05-25 MED ORDER — ACETAMINOPHEN 500 MG PO TABS
1000.0000 mg | ORAL_TABLET | Freq: Once | ORAL | Status: AC
  Administered 2014-05-25: 1000 mg via ORAL
  Filled 2014-05-25: qty 2

## 2014-05-25 MED ORDER — LORATADINE 10 MG PO TABS: 10.0000 mg | ORAL_TABLET | Freq: Every day | ORAL | Status: DC

## 2014-05-25 MED ORDER — FLUTICASONE PROPIONATE 50 MCG/ACT NA SUSP
2.0000 | Freq: Every day | NASAL | Status: DC
Start: 2014-05-26 — End: 2014-05-25
  Filled 2014-05-25: qty 16

## 2014-05-25 NOTE — ED Provider Notes (Signed)
Patient seen and evaluated. History reviewed. Increase disruptive behavior with hitting her head against the wall. No metabolic abdomen amount is here. No signs of trauma. Reassuring CT I discussed the case with Dr. Jae Direeddy's office. I also discussed her case at length with her care provider. It is currently 6:45 PM. Caregiver states she received a call that there was a new prescription for her at the pharmacy. Her intention is to fill that and begin it tonight. I think this is an appropriate plan. She is safe for discharge at this time.  Rolland PorterMark Hurbert Duran, MD 05/25/14 [phone]

## 2014-05-25 NOTE — ED Notes (Signed)
Patient caregiver at bedside.  States patient has not slept in 2 days. Caregiver attentive to patient needs. Diet order communicated to service response.

## 2014-05-25 NOTE — ED Notes (Signed)
OTF to CT with caregiver at bedside.

## 2014-05-25 NOTE — ED Notes (Addendum)
[facility]. Patient has been been displaying self-harm behaviors since before Christmas. Has worsened in the past two days. Has been throwing herself on the floor and banging her head into the wall/floor/chair. Now c/o headache. Patient has limited speech and knows some sign language. Has been known to hit others without warning. Patient has not slept in 2 days-this is not baseline according to caregiver. Takes 300 mg Trazadone at night. No other issues noted. RR even/unlabored. Psychiatrist would like patient to have psych evaluation also, along with physical evaluation.

## 2014-05-25 NOTE — ED Provider Notes (Signed)
CSN: [id number]     Arrival date & time 05/25/14  1338 History  This chart was scribed for Wynetta EmeryNicole Zakkary Thibault, PA-C, working with Dana CookeyMegan Docherty, MD by Dana Mcneil, ED Scribe. This patient was seen in room [facility]TR2/[facility]PT2 and the patient's care was started at 2:49 PM.     Chief Complaint  Patient presents with  . Self harm    . Back Pain  . Headache  . Medical Clearance    Level 5 caveat: Non-verbal.  HPI  Pt is not able to speak. Pt has a developmental disability.  HPI Comments: Dana Mcneil is a 24 y.o. female who presents to the Emergency Department complaining of psychotic behavior. [facility]. Per pt's caregiver: caregiver states the pt has not slept in two days. Pt has become increasingly psychotic. Pt has been hitting her head on anything the she gets close to. Pt has a past hx of this head-hitting behavior, but this behavior has significantly increased. Pt's caregiver also states that the pt has bruises all over her body, particularly on her thighs and buttox, which her caregiver believes are due to her throwing herself around. Pt is allergic to ativan and haldol. Pt's caregiver    Past Medical History  Diagnosis Date  . MR (mental retardation)    History reviewed. No pertinent past surgical history. Family History  Problem Relation Age of Onset  . Colon cancer Neg Hx   . Colon polyps Neg Hx   . Diabetes Father   . Kidney disease Neg Hx   . Esophageal cancer Neg Hx    History  Substance Use Topics  . Smoking status: Never Smoker   . Smokeless tobacco: Never Used  . Alcohol Use: No   OB History    No data available     Review of Systems  A complete 10 system review of systems was obtained and all systems are negative except as noted in the HPI and PMH.    Allergies  Haldol and Lorazepam  Home Medications   Prior to Admission medications   Medication Sig Start Date End Date Taking? Authorizing Provider  benztropine (COGENTIN) 0.5 MG  tablet Take 0.5 mg by mouth 2 (two) times daily.   Yes Historical Provider, MD  cetirizine (ZYRTEC) 10 MG tablet Take 10 mg by mouth daily.   Yes Historical Provider, MD  clonazePAM (KLONOPIN) 1 MG tablet Take 1 mg by mouth 3 (three) times daily.   Yes Historical Provider, MD  divalproex (DEPAKOTE) 250 MG DR tablet Take 750 mg by mouth at bedtime.    Yes Historical Provider, MD  fluticasone (FLONASE) 50 MCG/ACT nasal spray Place 2 sprays into both nostrils daily.   Yes Historical Provider, MD  OLANZapine (ZYPREXA) 15 MG tablet Take 30 mg by mouth daily.   Yes Historical Provider, MD  PRESCRIPTION MEDICATION MI Paste Plus. -- Apply pea size amount nightly with finger or Q tip after flossing and brushing until gone   Yes Historical Provider, MD  sertraline (ZOLOFT) 50 MG tablet Take 50 mg by mouth daily.   Yes Historical Provider, MD  traZODone (DESYREL) 150 MG tablet Take 300 mg by mouth at bedtime.   Yes Historical Provider, MD   BP 101/67 mmHg  Pulse 101  Temp(Src) 98.1 F (36.7 C) (Oral)  Resp 17  SpO2 100% Physical Exam  Constitutional: She appears well-developed and well-nourished.  HENT:  Head: Normocephalic and atraumatic.  Mouth/Throat: Oropharynx is clear and moist.  No abrasions or contusions.   No hemotympanum, battle signs or raccoon's eyes  No crepitance or tenderness to palpation along the orbital rim.  EOMI intact with no pain or diplopia  No abnormal otorrhea or rhinorrhea. Nasal septum midline.  No intraoral trauma.       Eyes: EOM are normal. Pupils are equal, round, and reactive to light.  Neck: Normal range of motion. Neck supple.  No midline C-spine  tenderness to palpation or step-offs appreciated. Patient has full range of motion without pain.   Cardiovascular: Normal rate, regular rhythm and intact distal pulses.   Pulmonary/Chest: Effort normal and breath sounds normal. No respiratory distress.  Abdominal: Soft. Bowel sounds are normal. She exhibits  no distension and no mass. There is no tenderness. There is no rebound and no guarding.  Neurological: She is alert.  calm  Skin: Skin is warm and dry.  Bruising in various stages of healing. No bony tenderness palpation.  Psychiatric: She has a normal mood and affect. Her behavior is normal.  Nursing note and vitals reviewed.   ED Course  Procedures  DIAGNOSTIC STUDIES: Oxygen Saturation is 100% on RA, normal by my interpretation.    COORDINATION OF CARE: 2:52 PM Discussed treatment plan with pt at bedside and pt agreed to plan.  Labs Review Labs Reviewed  ACETAMINOPHEN LEVEL - Abnormal; Notable for the following:    Acetaminophen (Tylenol), Serum <10.0 (*)    All other components within normal limits  COMPREHENSIVE METABOLIC PANEL - Abnormal; Notable for the following:    Potassium 3.3 (*)    AST 48 (*)    ALT 42 (*)    All other components within normal limits  URINE RAPID DRUG SCREEN (HOSP PERFORMED) - Abnormal; Notable for the following:    Benzodiazepines POSITIVE (*)    All other components within normal limits  VALPROIC ACID LEVEL - Abnormal; Notable for the following:    Valproic Acid Lvl 49.5 (*)    All other components within normal limits  CBC  ETHANOL  SALICYLATE LEVEL    Imaging Review No results found.   EKG Interpretation None      MDM   Final diagnoses:  Head trauma  Behavioral change   Filed Vitals:   05/25/14 1426  BP: 101/67  Pulse: 101  Temp: 98.1 F (36.7 C)  TempSrc: Oral  Resp: 17  SpO2: 100%    Medications  acetaminophen (TYLENOL) tablet 1,000 mg (1,000 mg Oral Given 05/25/14 1724)    Dana Mcneil is a pleasant 24 y.o. female who is nonverbal from severe MR presenting with her caregiver who states that she is becoming agitated with more self-harm behaviors. Patient is sedated for CT which shows no acute abnormality. Case discussed with attending physician who is personally evaluated the patient. Patient's psychiatrist has  adjusted her psychiatric medications.  Evaluation does not show pathology that would require ongoing emergent intervention or inpatient treatment. Pt is hemodynamically stable and mentating appropriately. Discussed findings and plan with patient/guardian, who agrees with care plan. All questions answered. Return precautions discussed and outpatient follow up given.   I personally performed the services described in this documentation, which was scribed in my presence. The recorded information has been reviewed and is accurate.    Wynetta Emery, PA-C 06/01/14 0304  Rolland Porter, MD 06/10/14 [phone]

## 2014-05-25 NOTE — BH Assessment (Signed)
Writer informed TTS ([place]) of the consult.  

## 2014-05-25 NOTE — Discharge Instructions (Signed)
Continue her current medications with the exception of her Zyprexa.  Start her new medication as called into her pharmacy by Dr. Betti Cruzeddy today

## 2014-05-25 NOTE — BH Assessment (Signed)
Assessment Note  Dana Mcneil is an 24 y.o. female who presents to the Emergency Department complaining of psychotic behavior. Per ED notes and collateral from caregiver.  Pt lives in a group home, [facility]. Per pt's caregiver: caregiver states the pt has not slept in two days. Pt has become increasingly irritable. Pt has been hitting her head on anything the she is able to get in her hands. She has a past hx of this head-hitting behavior, but this behavior has significantly increased. Pt's caregiver also states that the pt has bruises all over her body, particularly on her thighs, arms, and gluteal region. Care giver believes patient acquired bruises from throwing herself around around or on the floor.   Writer attempted to complete a TTS consult on patient. She is however very limited with speaking. Patient does know limited sign language. Information obtained from care giver/supervisor of group home-Dafney Staley [phone].    Axis I: Mood Disorder NOS Axis II: Deferred Axis III:  Past Medical History  Diagnosis Date  . MR (mental retardation)    Axis IV: other psychosocial or environmental problems and problems related to social environment Axis V: 41-50 serious symptoms  Past Medical History:  Past Medical History  Diagnosis Date  . MR (mental retardation)     History reviewed. No pertinent past surgical history.  Family History:  Family History  Problem Relation Age of Onset  . Colon cancer Neg Hx   . Colon polyps Neg Hx   . Diabetes Father   . Kidney disease Neg Hx   . Esophageal cancer Neg Hx     Social History:  reports that she has never smoked. She has never used smokeless tobacco. She reports that she does not drink alcohol or use illicit drugs.  Additional Social History:  Alcohol / Drug Use Pain Medications: SEE MAR Prescriptions: SEE MAR Over the Counter: SEE MAR History of alcohol / drug use?: No history of alcohol / drug abuse  CIWA: CIWA-Ar BP:  101/67 mmHg Pulse Rate: 101 COWS:    Allergies:  Allergies  Allergen Reactions  . Haldol [Haloperidol Lactate]     hyperactive  . Lorazepam     hyperactive    Home Medications:  (Not in a hospital admission)  OB/GYN Status:  No LMP recorded. Patient has had an injection.  General Assessment Data Location of Assessment: [facility] ED Is this a Tele or Face-to-Face Assessment?: Face-to-Face Is this an Initial Assessment or a Re-assessment for this encounter?: Initial Assessment Living Arrangements: Other (Comment) (lives in a group home-[facility]) Can pt return to current living arrangement?: Yes Admission Status: Voluntary Is patient capable of signing voluntary admission?: Yes Transfer from: Acute Hospital Referral Source: Self/Family/Friend     [facility] Crisis Care Plan Living Arrangements: Other (Comment) (lives in a group home-[facility]) Name of Psychiatrist:  (Dr. Betti Cruz ) Name of Therapist:  (No therapist )  Education Status Is patient currently in school?: No  Risk to self with the past 6 months Suicidal Ideation: No (Patient is non verbal; unable to confirm and deny) Suicidal Intent: No Is patient at risk for suicide?: No Suicidal Plan?: No Access to Means: No What has been your use of drugs/alcohol within the last 12 months?:  (n/a) Previous Attempts/Gestures: No How many times?:  (n/a) Other Self Harm Risks:  (n/a) Triggers for Past Attempts: Other (Comment) (n/a) Intentional Self Injurious Behavior:  (banging head on objects, scratching, biting, throwing self o) Family Suicide History: Unknown Recent stressful  life event(s): Other (Comment) (patient told caregiver her head hurts) Persecutory voices/beliefs?: Yes Depression:  (unable to determine) Depression Symptoms:  (n/a) Substance abuse history and/or treatment for substance abuse?: No Suicide prevention information given to non-admitted patients: Not applicable  Risk to Others within the past 6  months Homicidal Ideation:  (Patient unable to confirm or deny; she is non verbal ) Thoughts of Harm to Others:  (n/a) Current Homicidal Intent:  (n/a) Current Homicidal Plan:  (n/a) Access to Homicidal Means:  (n/a) Identified Victim:  (n/a) History of harm to others?:  (pt has a hx of trying to bite or scratch others-baseline) Assessment of Violence: None Noted Violent Behavior Description:  (patient calm and cooperative ) Does patient have access to weapons?: No Criminal Charges Pending?: No Does patient have a court date: No  Psychosis Hallucinations:  (caregiver doesn't suspect AVH's; pt unable confirm/deny) Delusions: Unspecified  Mental Status Report Appear/Hygiene: In hospital gown Eye Contact: Fair Motor Activity: Freedom of movement Speech: Logical/coherent Level of Consciousness: Alert Mood: Depressed Affect: Appropriate to circumstance Anxiety Level: None (UTA) Thought Processes: Unable to Assess Judgement: Unable to Assess Orientation: Unable to assess Obsessive Compulsive Thoughts/Behaviors: Unable to Assess  Cognitive Functioning Concentration: Unable to Assess Memory: Unable to Assess IQ:  (patient has a MR diagnosis (moderate)) Insight: Unable to Assess Impulse Control: Unable to Assess Appetite: Fair Weight Loss:  (unk) Weight Gain:  (unk) Sleep: Unable to Assess Vegetative Symptoms: Unable to Assess  ADLScreening [facility] Assessment Services) Patient's cognitive ability adequate to safely complete daily activities?: Yes Patient able to express need for assistance with ADLs?: No (patient is mostly non verbal; limited in speech; uses sign language) Independently performs ADLs?: Yes (appropriate for developmental age)  Prior Inpatient Therapy Prior Inpatient Therapy:  (caregiver at bedside unsure) Prior Therapy Dates:  (n/a) Prior Therapy Facilty/Provider(s):  (n/a) Reason for Treatment:  (n/a)  Prior Outpatient Therapy Prior Outpatient Therapy:  Yes Prior Therapy Dates:  (current) Prior Therapy Facilty/Provider(s):  (Dr. Betti Cruz )  ADL Screening (condition at time of admission) Patient's cognitive ability adequate to safely complete daily activities?: Yes Is the patient deaf or have difficulty hearing?: No Does the patient have difficulty seeing, even when wearing glasses/contacts?: No Does the patient have difficulty concentrating, remembering, or making decisions?: Yes (MR (moderate) dx's) Patient able to express need for assistance with ADLs?: No (patient is mostly non verbal; limited in speech; uses sign language) Does the patient have difficulty dressing or bathing?: Yes (Needs prompting) Independently performs ADLs?: Yes (appropriate for developmental age) Communication: Needs assistance, Appropriate for developmental age Is this a change from baseline?: Pre-admission baseline Dressing (OT): Needs assistance, Appropriate for developmental age Is this a change from baseline?: Pre-admission baseline Grooming: Needs assistance, Appropriate for developmental age Is this a change from baseline?: Pre-admission baseline Feeding: Appropriate for developmental age, Needs assistance (Patien thas dysphagia ) Is this a change from baseline?: Pre-admission baseline Bathing: Needs assistance, Appropriate for developmental age Is this a change from baseline?: Pre-admission baseline Toileting: Needs assistance, Appropriate for developmental age Is this a change from baseline?: Pre-admission baseline In/Out Bed: Needs assistance, Appropriate for developmental age Is this a change from baseline?: Pre-admission baseline Walks in Home: Independent Does the patient have difficulty walking or climbing stairs?: Yes Weakness of Legs: None Weakness of Arms/Hands: None  Home Assistive Devices/Equipment Home Assistive Devices/Equipment: None    Abuse/Neglect Assessment (Assessment to be complete while patient is alone) Physical Abuse: Denies  (Per caregiver, no reported hx) Verbal Abuse:  Denies (Per caregiver, no reported hx) Sexual Abuse: Denies (per caregiver, no reported hx) Exploitation of patient/patient's resources: Denies Self-Neglect:  (unk) Values / Beliefs Cultural Requests During Hospitalization: None Spiritual Requests During Hospitalization: None   Advance Directives (For Healthcare) Does patient have an advance directive?: No    Additional Information 1:1 In Past 12 Months?: No CIRT Risk: No Elopement Risk: No Does patient have medical clearance?: Yes     Disposition:  Disposition Initial Assessment Completed for this Encounter: Yes Disposition of Patient: Other dispositions (Per Renata Capriceonrad, NP, d/c home (no criteria); [facility] Other disposition(s): Other (Comment) (Discharge home after medical clearance)  On Site Evaluation by:   Reviewed with Physician:    Melynda Rippleerry, Shivani Barrantes [facility] 05/25/2014 4:52 PM

## 2014-05-26 ENCOUNTER — Other Ambulatory Visit: Payer: Self-pay | Admitting: *Deleted

## 2014-05-26 DIAGNOSIS — R1310 Dysphagia, unspecified: Secondary | ICD-10-CM

## 2014-05-31 ENCOUNTER — Ambulatory Visit: Payer: Medicaid Other | Attending: Gastroenterology

## 2014-05-31 DIAGNOSIS — R1312 Dysphagia, oropharyngeal phase: Secondary | ICD-10-CM | POA: Insufficient documentation

## 2014-05-31 NOTE — Therapy (Signed)
[facility] [address] [place], [place], [address] Phone: [phone]   Fax:  [phone]  Speech Language Pathology Evaluation  Patient Details  Name: Dana Mcneil MRN: [id number] Date of Birth: Nov 25, 1990 Referring Provider:  Louis Meckel, MD  Encounter Date: 05/31/2014      End of Session - 05/31/14 1703    Visit Number 1   Number of Visits 8  requested   Date for SLP Re-Evaluation 07/30/14   Authorization Type medicaid - info given to LL on 06-01-14   SLP Start Time 1406   SLP Stop Time  1449   SLP Time Calculation (min) 43 min   Activity Tolerance Patient tolerated treatment well      Past Medical History  Diagnosis Date  . MR (mental retardation)     No past surgical history on file.  There were no vitals taken for this visit.  Visit Diagnosis: Dysphagia, oropharyngeal      Subjective Assessment - 05/31/14 1702    Symptoms Pt with clear voice upon entering ST room. Pt with incr'd difficulty with coughing during meals in the last 5-6 months, per mother. MBSS completed 05-02-14 suggesting OPST. [name] (cargiver), Reita Cliche (father), and [name] (mother) attended eval today.    Pt presents today with mild mod oral and severe pharyngeal dysphagia, so deemed by a modified barium swallow evaluation (MBSS) dated 05-02-14. Please see that report for further details. Caregiver [name] reports that pt's incidence of coughing during meals is "much better" since pt is following and is reminded to follow swallow precautions from MBSS. She states pt does not like to have small bites but is mostly tolerant of this. Sometimes pt has acted out due to the need for small bites. Pt was without overt s/s aspiration PNA today. SLP used skilled observation with pt during POs (chicken tenders, french fries and nectar-thick soda). Pt's caregiver provided appropriate cues to remind pt to adhere to precautions for small bites,  small sips, and swallow to ensure pt without oral residue/pocketing. Pt caregiver checked pt oral cavity for pocketed food and cued for pt to swallow when pocketing found. Pt, although with silent aspiration and penetration during the MBSS, had a clear voice throughout POs today.  This SLP recommends skilled ST to focus on 1) pt adherence to swallow precautions including throat clear/cough after each swallow and multiple swallows for each bite/sip and 2) swallowing HEP to strengthen swallowing musculature and decr aspiration risk.  Mother, it should be noted, thinks swallowing issues may be partly due to medication interaction causing pt lethargy and reduced pt attention/interaction.                 SLP Long Term Goals - 05/31/14 1706    SLP LONG TERM GOAL #1   Title pt and/or pt's caregiver will demo correct procedure for HEP    Baseline HEP provided next visit   Time 4   Period Weeks   Status New   SLP LONG TERM GOAL #2   Title pt will demo swallow precautions with occasional min A   Time 4   Period Weeks   Status New          Plan - 05/31/14 1704    Speech Therapy Frequency 2x / week   Duration 4 weeks   Treatment/Interventions Aspiration precaution training;Pharyngeal strengthening exercises;Oral motor exercises;Compensatory strategies;Diet toleration management by SLP;Patient/family education;SLP instruction and feedback   Potential to Achieve Goals Fair   Potential Considerations  Previous level of function;Severity of impairments   SLP Home Exercise Plan HEP will be provided next visit   Consulted and Agree with Plan of Care Patient;Family member/caregiver   Family Member Consulted mother/father        Problem List Patient Active Problem List   Diagnosis Date Noted  . 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 Irritability and anger 04/16/2013  . Aggression 04/16/2013  . PTSD (post-traumatic stress disorder) 04/16/2013  . GENERALIZED ANXIETY DISORDER  10/20/2007  . ESOPHAGEAL REFLUX 10/20/2007  . Dysphagia 10/20/2007  . DIARRHEA 10/20/2007    [facility] Center[facility]IN[facility],Kaija Kovacevic 05/31/2014, 5:09 PM  [place] [facility] [address] [place], [place], [address] Phone: [phone]   Fax:  [phone]

## 2014-05-31 NOTE — Patient Instructions (Addendum)
Continue with all recommendations from modified barium swallow exam on 05-02-14. It was good that no signs or symptoms of airway difficulty were present today and that Dana Mcneil had a clear voice. A "wet" or gurgly voice indicates possible airway compromise.

## 2014-06-07 ENCOUNTER — Ambulatory Visit: Payer: Medicaid Other

## 2014-06-13 ENCOUNTER — Ambulatory Visit: Payer: Medicaid Other

## 2014-06-13 DIAGNOSIS — R1312 Dysphagia, oropharyngeal phase: Secondary | ICD-10-CM

## 2014-06-13 NOTE — Therapy (Signed)
[facility] [address] [place], [place], [address] Phone: [phone]   Fax:  [phone]  Speech Language Pathology Treatment  Patient Details  Name: Dana Mcneil MRN: [id number] Date of Birth: Aug 08, 1990 Referring Provider:  Marva PandaMillsaps, Kimberly, NP  Encounter Date: 06/13/2014      End of Session - 06/13/14 1442    Authorization Time Period 06-06-14 to 07-03-14   Authorization - Visit Number 2   Authorization - Number of Visits 4   SLP Start Time 1403   SLP Stop Time  1440   SLP Time Calculation (min) 37 min      Past Medical History  Diagnosis Date  . MR (mental retardation)     No past surgical history on file.  There were no vitals taken for this visit.  Visit Diagnosis: Dysphagia, oropharyngeal      Subjective Assessment - 06/13/14 1552    Symptoms Pt entered room with clear voice. She is accompanied by Dana KaufmannMelissa, a person who transports pt to medical appointments. Dana KaufmannMelissa has agreed to tell Dana HerbertDaphne that someone from group home must attend therapy with Dana Mcneil from now on.             ADULT SLP TREATMENT - 06/13/14 1407    General Information   Behavior/Cognition Alert;Pleasant mood;Cooperative   Treatment Provided   Treatment provided Dysphagia   Dysphagia Treatment   Treatment Methods Therapeutic exercise   Other treatment/comments SLP introduced HEP for dysphagia with pt today. She performed approx 75% with effortful swallow, approx 50% with tongue press, and <15% success with pitch raise, mendelsohn, breath hold ("Huh!"), and lingual ROM. SLP sent instructions for HEP home with pt, via Dana Mcneil. Dana Mcneil again assured SLP that she would tell Dana Mcneil someone from group home must come to therapy with pt in next two sessions.   Pain Assessment   Pain Assessment No/denies pain   Assessment / Recommendations / Plan   Plan Continue with current plan of care  pt must complete HEP between tx days to  strengthen muscles   Dysphagia Recommendations   Diet recommendations --  as rec on modified barium swallow exam   Progression Toward Goals   Progression toward goals Progressing toward goals          SLP Education - 06/13/14 1555    Education provided Yes   Education Details HEP   Person(s) Educated Patient  Dana Mcneil, pt's driver   Methods Explanation;Demonstration;Tactile cues   Comprehension Returned demonstration;Verbal cues required;Tactile cues required;Need further instruction;Verbalized understanding            SLP Long Term Goals - 06/13/14 1556    SLP LONG TERM GOAL #1   Title pt and/or pt's caregiver will demo understanding of correct procedure for HEP with rare min A   Baseline HEP provided next visit   Time 4   Period Weeks   Status Revised   SLP LONG TERM GOAL #2   Title pt will demo swallow precautions with occasional min A   Baseline with usual min A   Time 4   Period Weeks   Status On-going          Plan - 06/13/14 1443    Clinical Impression Statement Pt presents with difficulty with basic oral motor (including lingual) movements, making dysphagia therapy exercises challenging. SLP believes pt's best chance for incr'd success  based upon pt's current status is likely effortful swallow.  Medicaid authorized 3 therapy visits untli 07-03-14.   Speech  Therapy Frequency 1x /week   Duration --  until 07-03-14   Treatment/Interventions Aspiration precaution training;Pharyngeal strengthening exercises;Oral motor exercises;Compensatory strategies;Diet toleration management by SLP;Patient/family education;SLP instruction and feedback   Potential to Achieve Goals Fair   Potential Considerations Previous level of function;Medical prognosis;Severity of impairments;Ability to learn/carryover information   SLP Home Exercise Plan HEP began today. SLP will need someone from group home here next visit, as Dana Mcneil is essentially pt's transport.   Consulted and Agree  with Plan of Care Patient        Problem List Patient Active Problem List   Diagnosis Date Noted  . 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 Irritability and anger 04/16/2013  . Aggression 04/16/2013  . PTSD (post-traumatic stress disorder) 04/16/2013  . GENERALIZED ANXIETY DISORDER 10/20/2007  . ESOPHAGEAL REFLUX 10/20/2007  . Dysphagia 10/20/2007  . DIARRHEA 10/20/2007    [facility], SLP 06/13/2014, 3:57 PM  [facility] [facility] [address] [place], [place], [address] Phone: [phone]   Fax:  [phone]

## 2014-06-13 NOTE — Patient Instructions (Signed)
      SWALLOWING EXERCISES Do these 6 days per week until March 31st  Please have someone at home help you do these    1. Effortful Swallows - Squeeze hard with the muscles in your neck while you swallow your  saliva, a small sip of nectar liquid, or a couple DROPS of water - Repeat 15-20 times, 3-4 times a day, and use whenever you eat or drink    2. Pitch Raise - Repeat "he", once per second in as high of a pitch as you can - Repeat 20 times, 2 times a day   3. Tongue Press - Press your entire tongue as hard as you can against the roof of your mouth for 3-5 seconds - Repeat 20 times, 3-4 times a day   4. Breath Hold - Say "HUH!" loudly, holding your breath tightly at the level of your voice box for 3 seconds - Repeat 20 times, 3-4 times a day

## 2014-06-20 ENCOUNTER — Encounter: Payer: Self-pay | Admitting: *Deleted

## 2014-06-20 ENCOUNTER — Ambulatory Visit: Payer: Medicaid Other | Attending: Gastroenterology

## 2014-06-20 ENCOUNTER — Encounter: Payer: Medicaid Other | Attending: Gastroenterology | Admitting: *Deleted

## 2014-06-20 DIAGNOSIS — Z713 Dietary counseling and surveillance: Secondary | ICD-10-CM | POA: Insufficient documentation

## 2014-06-20 DIAGNOSIS — R1310 Dysphagia, unspecified: Secondary | ICD-10-CM

## 2014-06-20 DIAGNOSIS — E669 Obesity, unspecified: Secondary | ICD-10-CM | POA: Diagnosis not present

## 2014-06-20 DIAGNOSIS — Z6824 Body mass index (BMI) 24.0-24.9, adult: Secondary | ICD-10-CM | POA: Insufficient documentation

## 2014-06-20 DIAGNOSIS — R1312 Dysphagia, oropharyngeal phase: Secondary | ICD-10-CM | POA: Diagnosis present

## 2014-06-20 NOTE — Progress Notes (Signed)
  Medical Nutrition Therapy:  Appt start time: 1000 end time:  1100.  Assessment:  Primary concerns today: Dana Mcneil is here with both parents and care-giver for nutrition counseling pertaining to recent diagnosis of dysphagia.  She has been recommended to follow a level 3 dysphagia diet and nectar-thick liquids.  They have been following recommendations they found online for the past couple weeks.  They have been altering the textures of her foods and have been adding thickener to her beverages.  However she is still consuming many frozen desserts that melt to thin liquids and are contraindicated on a nectar-thin diet.  The family did not realize this.  Dana Mcneil lives in her own home in a group home facility.    she has 2 roomates.  She has been there for a year, but she needs to move .  All her meals are prepared for her by a care-giver.  This dysphagia is a recent issue that her parents attribute to her high level of medications that may have altered her sedation state.  She has experienced some trauma that led to a need to increase psychotropic medications and parents are hoping to back off the dose in the future.    Preferred Learning Style:   Auditory  Visual  Learning Readiness:   Change in progress   MEDICATIONS: see list   DIETARY INTAKE:  Usual eating pattern includes 3 meals and 2-3 snacks per day.  Everyday foods include all.  Avoided foods include anything restricted on dysphagia diet.    24-hr recall:  B ( AM): eggs, bacon and orange juice or cranberry juice  Snk ( AM): pudding or yogurt, jello  L ( PM): ravioli or noodles - no breads, has pudding or fruit snacks and juice or water Snk ( PM): yogurt or ice cream D ( PM): lasagna hamburger helper with mixed vegetables Snk ( PM): cookies Beverages: jucie, water- thickened to nectar consistency   Usual physical activity: goes for walks often  Estimated energy needs: 1600 calories    Nutritional Diagnosis:  [place]-1.1  Swallowing difficulty As related to thin liquids and dry, coarse foods.  As evidenced by dysphagia diagnosis.    Intervention:  Nutrition counseling provided.  Discussed dietary recommendations for level 3 dysphagia diet and nectar-thin liquids.   Discouraged ice creams, jellos, etc that can melt to thin liquids  Teaching Method Utilized:  Auditory   Handouts given during visit include:  Medical nutrition therapy for level 3 dysphagia   Barriers to learning/adherence to lifestyle change: none  Demonstrated degree of understanding via:  Teach Back   Monitoring/Evaluation:  Dietary intake, exercise, and body weight prn.

## 2014-06-20 NOTE — Therapy (Signed)
[facility] [address] [place], [place], [address] Phone: [phone]   Fax:  [phone]  Speech Language Pathology Treatment  Patient Details  Name: Dana Mcneil MRN: [id number] Date of Birth: [birth year] Referring Provider:  Marva PandaMillsaps, Kimberly, NP  Encounter Date: 06/20/2014      End of Session - 06/20/14 1636    Visit Number 3   Number of Visits 4   Date for SLP Re-Evaluation 07/30/14   Authorization Type Medicaid   Authorization Time Period 06-06-14 to 07-03-14   Authorization - Visit Number 3   Authorization - Number of Visits 4   SLP Start Time 1406   SLP Stop Time  1443   SLP Time Calculation (min) 37 min   Activity Tolerance Patient tolerated treatment well      Past Medical History  Diagnosis Date  . MR (mental retardation)   . [name]     No past surgical history on file.  There were no vitals taken for this visit.  Visit Diagnosis: Dysphagia, oropharyngeal      Subjective Assessment - 06/20/14 1410    Symptoms Pt five minutes late. Melissa, pt's attendant, arrived for therapy today (SLP requested pt's caregivers from group home attend therapy with pt). "I told them and gave them the (exercises)," Melissa said.             ADULT SLP TREATMENT - 06/20/14 1409    General Information   Behavior/Cognition Alert;Cooperative;Pleasant mood   HPI SLP asked for pt's caregivers at group home to come-this did not happen. SLP phoned pt's caregiver Daphene, and informed her of this. She confirmed she would be present for pt's last session 06-27-14. She stated all but effortful swallow were difficult for pt at home. SLP confirmed the same happening at ST as well and encouraged to keep attempting all other exercises x7-8 reps and then more if successful. Effortful swallows 20x for pt at home recommended.   Treatment Provided   Treatment provided Dysphagia   Dysphagia Treatment   Treatment Methods Skilled observation;Therapeutic exercise   Patient observed directly with PO's Yes   Type of PO's observed Dysphagia 3 (soft)   Feeding Able to feed self   Oral Phase Signs & Symptoms --  cues to swallow and not pocket   Pharyngeal Phase Signs & Symptoms --  none   Other treatment/comments Pt with consistent min-mod cues for effortful swallow- 60% success (6/10). <10% success with remaining exercises (mendelsohn, pitch raise, tongue press, breath hold;laryngeal adduction).    Pain Assessment   Pain Assessment No/denies pain   Assessment / Recommendations / Plan   Plan Continue with current plan of care   Dysphagia Recommendations   Diet recommendations --  as rec on modified barium swallow exam   Compensations Slow rate;Small sips/bites;Check for pocketing;Multiple dry swallows after each bite/sip;Follow solids with liquid  clear throat intermittently is helpful   Postural Changes and/or Swallow Maneuvers --  as directed on modified barium swallow exam   Progression Toward Goals   Progression toward goals Progressing toward goals          SLP Education - 06/20/14 1635    Education provided Yes   Education Details HEP   Person(s) Educated Patient;Caregiver(s)   Methods Explanation;Demonstration;Tactile cues;Verbal cues   Comprehension Verbalized understanding;Returned demonstration;Verbal cues required;Tactile cues required;Need further instruction            SLP Long Term Goals - 06/20/14 [id number]  SLP LONG TERM GOAL #1   Title pt and/or pt's caregiver will demo understanding of correct procedure for HEP with rare min A   Baseline HEP provided next visit   Time 1   Period Weeks   Status Revised   SLP LONG TERM GOAL #2   Title pt will demo swallow precautions with occasional min A   Baseline with usual min A   Time 1   Period Weeks   Status On-going          Plan - 06/20/14 1636    Clinical Impression Statement Pt cont with difficulty with  basic oral motor movements. Today pt with difficulty with tongue press, which was easier for her last session. Pt req'd tactile and verbal cues along with demo cues with little success. Pt's caregiver assured SLP that she would come with pt 06-27-14.   Speech Therapy Frequency 1x /week   Duration 1 week   Treatment/Interventions Aspiration precaution training;Pharyngeal strengthening exercises;Oral motor exercises;Compensatory strategies;Diet toleration management by SLP;Patient/family education;SLP instruction and feedback   Potential to Achieve Goals Fair   Potential Considerations Previous level of function;Medical prognosis;Severity of impairments;Ability to learn/carryover information        Problem List Patient Active Problem List   Diagnosis Date Noted  . 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 Irritability and anger 04/16/2013  . Aggression 04/16/2013  . PTSD (post-traumatic stress disorder) 04/16/2013  . GENERALIZED ANXIETY DISORDER 10/20/2007  . ESOPHAGEAL REFLUX 10/20/2007  . Dysphagia 10/20/2007  . DIARRHEA 10/20/2007    [facility], SLP 06/20/2014, 4:39 PM  [facility] [address] [place], [place], [address] Phone: [phone]   Fax:  [phone]

## 2014-06-27 ENCOUNTER — Ambulatory Visit: Payer: Medicaid Other

## 2014-06-27 DIAGNOSIS — R1312 Dysphagia, oropharyngeal phase: Secondary | ICD-10-CM

## 2014-06-27 NOTE — Therapy (Signed)
[facility] [address] [place], [place], [address] Phone: [phone]   Fax:  [phone]  Patient Details  Name: Dana Mcneil MRN: [id number] Date of Birth: January 09, 1991 Referring Provider:  Marva PandaMillsaps, Kimberly, NP  Encounter Date: 06/27/2014 Pt's caregiver Daphene arrived at clinic at approx 1320 alone, as pt was driven by other staff and had not yet arrived. By 1329, pt had still not yet arrived. SLP suggested that since this was pt's final visit authorized by Medicaid that the full 45 minutes be captured in the therapy session due to SLP teaching caregiver procedure and aim of pt's HEP. Daphene agreed this was best, and rescheduled pt's appointment to tomorrow (06-28-14) at 1100.  [facility]L 06/27/2014, 1:32 PM  [facility] [address] [place], [place], [address] Phone: [phone]   Fax:  [phone]

## 2014-06-28 ENCOUNTER — Ambulatory Visit: Payer: Medicaid Other

## 2014-06-28 DIAGNOSIS — R1312 Dysphagia, oropharyngeal phase: Secondary | ICD-10-CM

## 2014-06-28 NOTE — Patient Instructions (Signed)
Make sure Dana Mcneil does 20 hard swallows, three times each day. Demonstrate each hard swallow for her, with a normal chin and neck position. She should use a SMALL sip of nectar liquid for each of these hard swallows. Check her voice every 5 swallows to make sure it doesn't sound "gurgly". If it does, model a throat clear for her twice and have her try to clear her throat. Then have her swallow hard.  Try 10-15 tongue press exercises with her, three times a day, demonstrating how she should do it each time.   If Dana Mcneil is to have any possible success for strengthening these swallowing muscles these exercises MUST be done as directed.  She should be scheduled for another modified barium swallow test in 8 weeks. Do these exercises until that test.

## 2014-06-28 NOTE — Therapy (Addendum)
[facility] [address] [place], [place], [address] Phone: [phone]   Fax:  [phone]  Speech Language Pathology Treatment  Patient Details  Name: Dana Mcneil MRN: [id number] Date of Birth: 07/05/1990 Referring Provider:  [name], NP  Encounter Date: 06/28/2014      End of Session - 06/28/14 1607    Visit Number 4   Number of Visits 4   Date for SLP Re-Evaluation 07/30/14   Authorization Type Medicaid   Authorization Time Period 06-06-14 to 07-03-14   Authorization - Visit Number 4   Authorization - Number of Visits 4   SLP Start Time 1106   SLP Stop Time  1140   SLP Time Calculation (min) 34 min   Activity Tolerance Patient tolerated treatment well      Past Medical History  Diagnosis Date  . MR (mental retardation)   . [name]     No past surgical history on file.  There were no vitals taken for this visit.  Visit Diagnosis: Dysphagia, oropharyngeal      Subjective Assessment - 06/28/14 1605    Symptoms Pt indicates HEP is going well. Pt with increased anxiety today, per caregiver..             ADULT SLP TREATMENT - 06/28/14 1112    General Information   Behavior/Cognition Alert;Cooperative;Pleasant mood   Treatment Provided   Treatment provided Dysphagia   Dysphagia Treatment   Treatment Methods Therapeutic exercise;Patient/caregiver education   Patient observed directly with PO's Yes   Type of PO's observed --  < 1/4 teaspoons H2O with effortful swallows   Other treatment/comments Trained caregiver Daphene to perform effortful swallow and tongue press exercises with pt. These were the only exercises pt could perform with any degree of real success in past sessions. Caregiver was 100% successful in guiding pt through these two exercises by session end. SLP stressed pt must have clear voice - and encouraged Daphene check pt's voice every 5 effortful  swallows. Pt demo'd limited success with cued throat clear so SLP suggested pt shout loud "hey!" if unable to throat clear, and then effortful swallow to clear any material from pharynx. SLP wrote detailed instructions see "pt instructions" for Daphene and staff at pt's home.    Pain Assessment   Pain Assessment No/denies pain   Assessment / Recommendations / Plan   Plan Discharge SLP treatment due to (comment)  Medicaid auth expires and pt does not wish to self pay.   Dysphagia Recommendations   Diet recommendations Nectar-thick liquid  diet as directed on modified barium swallow exam   Compensations --  as directed on modified barium swallow   Progression Toward Goals   Progression toward goals Progressing toward goals          SLP Education - 06/28/14 1144    Education provided Yes   Education Details effortful swallow, tongue press   Person(s) Educated Patient;Caregiver(s)  Daphene   Methods Explanation;Demonstration;Verbal cues;Handout   Comprehension Verbalized understanding;Returned demonstration            SLP Long Term Goals - 06/28/14 1609    SLP LONG TERM GOAL #1   Title pt and/or pt's caregiver will demo understanding of correct procedure for HEP with rare min A   Time 1   Period Weeks   Status Achieved   SLP LONG TERM GOAL #2   Title pt will demo swallow precautions with occasional min A   Time  1   Period Weeks   Status Not Met          Plan - 06/28/14 1608    Clinical Impression Statement Pt cont with difficulty with basic oral motor movements. Pt req'd tactile and verbal cues along with demo cues with each exercise today. Pt's caregiver Daphene is now trained on how pt should complete effortful swallow and tongue press. Pt will be d/c'd due to financial reasons.   Treatment/Interventions Aspiration precaution training;Pharyngeal strengthening exercises;Oral motor exercises;Compensatory strategies;Diet toleration management by SLP;Patient/family  education;SLP instruction and feedback   Potential to Achieve Goals Fair   Potential Considerations Previous level of function;Medical prognosis;Severity of impairments;Ability to learn/carryover information     [place] SUMMARY  Visits from Start of Care: 4  Current functional level related to goals / functional outcomes: Pt's caregiver Daphene was taught how best to work with pt for the best success possible with pt's HEP (effortful swallow and tongue press) in order to possibly strengthen pt's swallowing musculature.  Pt's best success over time was with effortful swallow and to some extent tongue press exercises, due to significant difficulty with basic oral-motor movements. Pt's caregiver reported and pt agreed that incidence of coughing is considerably less now that pt is on nectar-thick liquids and portion/bite size control is managed. Pt's voice remained clear prior to and during each therapy session. Notably, pt exhibited hydrophonic ("wet") voice when entering room for modified barium swallow exam.    Remaining deficits: Dysphagia remains.    Education / Equipment: HEP, need to throat clear/cough and reswallow with "wet" voice. Plan: Patient agrees to discharge.  Patient goals were partially met. Patient is being discharged due to financial reasons.  ?????  PT SHOULD RECEIVE FOLLOW UP MODIFIED BARIUM SWALLOW EXAM IN APPROX 8 WEEKS TO POSSIBLY UPGRADE DIET/LIQUID RECOMMENDATION. PLEASE ORDER AT THAT TIME, IF AGREED.     Problem List Patient Active Problem List   Diagnosis Date Noted  . 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 Irritability and anger 04/16/2013  . Aggression 04/16/2013  . PTSD (post-traumatic stress disorder) 04/16/2013  . GENERALIZED ANXIETY DISORDER 10/20/2007  . ESOPHAGEAL REFLUX 10/20/2007  . Dysphagia 10/20/2007  . DIARRHEA 10/20/2007    Garald Balding, SLP 06/28/2014, 4:21 PM  [facility] [address] [place], [place], [address] Phone: [phone]   Fax:  [phone]

## 2014-10-19 DIAGNOSIS — R634 Abnormal weight loss: Secondary | ICD-10-CM | POA: Insufficient documentation

## 2014-10-19 DIAGNOSIS — J302 Other seasonal allergic rhinitis: Secondary | ICD-10-CM | POA: Insufficient documentation

## 2014-12-12 ENCOUNTER — Encounter: Payer: Self-pay | Admitting: Gastroenterology

## 2015-03-20 ENCOUNTER — Other Ambulatory Visit (HOSPITAL_COMMUNITY): Payer: Self-pay | Admitting: Gastroenterology

## 2015-03-20 DIAGNOSIS — R1310 Dysphagia, unspecified: Secondary | ICD-10-CM

## 2015-03-27 ENCOUNTER — Ambulatory Visit (HOSPITAL_COMMUNITY)
Admission: RE | Admit: 2015-03-27 | Discharge: 2015-03-27 | Disposition: A | Discharge status: Home / Self Care | Payer: Medicaid Other | Source: Ambulatory Visit | Attending: Gastroenterology | Admitting: Gastroenterology

## 2015-03-27 DIAGNOSIS — R1312 Dysphagia, oropharyngeal phase: Secondary | ICD-10-CM | POA: Insufficient documentation

## 2015-03-27 DIAGNOSIS — R1310 Dysphagia, unspecified: Secondary | ICD-10-CM

## 2015-03-27 DIAGNOSIS — F79 Unspecified intellectual disabilities: Secondary | ICD-10-CM | POA: Diagnosis not present

## 2015-03-27 NOTE — Progress Notes (Signed)
MBSS complete. Full report located under chart review in imaging section. Harlon DittyBonnie Tighe [place], [place] CCC-SLP [phone]     03/27/15 1100  SLP G-Codes **NOT FOR INPATIENT CLASS**  Functional Assessment Tool Used clinical judgement  Functional Limitations Swallowing  Swallow Current Status [phone](G8996) CK  Swallow Goal Status ([id number](G8997) CK  Swallow Discharge Status (H0865(G8998) CK  SLP Evaluations  $ SLP Speech Visit 1 Procedure  SLP Evaluations  $Swallowing Treatment 1 Procedure  $MBS Swallow Outpatient 1 Procedure

## 2015-04-03 ENCOUNTER — Ambulatory Visit: Payer: Self-pay | Admitting: Gastroenterology

## 2015-04-20 DIAGNOSIS — R1313 Dysphagia, pharyngeal phase: Secondary | ICD-10-CM | POA: Insufficient documentation

## 2015-04-20 DIAGNOSIS — E559 Vitamin D deficiency, unspecified: Secondary | ICD-10-CM | POA: Insufficient documentation

## 2015-07-17 DIAGNOSIS — K08409 Partial loss of teeth, unspecified cause, unspecified class: Secondary | ICD-10-CM | POA: Insufficient documentation

## 2015-10-22 DIAGNOSIS — R479 Unspecified speech disturbances: Secondary | ICD-10-CM | POA: Insufficient documentation

## 2018-02-09 ENCOUNTER — Ambulatory Visit (HOSPITAL_COMMUNITY)
Admission: EM | Admit: 2018-02-09 | Discharge: 2018-02-09 | Disposition: A | Discharge status: Home / Self Care | Payer: Medicaid Other

## 2018-02-09 NOTE — ED Notes (Signed)
Pt was here with a care giver stating that she lives in a "AFL".  She reports that the patients parents want her to be "scanned" for swallowing problems.  Pt was seen by GI at the beginning of the month and possibly had a swallow study done at that time and the results were to be sent to her PCP.  No f/u by the parents has been done to GI or the PCP.    Spoke with Dr. Tracie HarrierHagler and he agreed that we would be happy to speak with the pt and caregiver, but that we are unable to do any proper scans for the pt at this facility.  She was advised to let the parents know that they need to call GI or her PCP for further treatment, scans and/or plans for the pt.  Pt was in NAD and they were advised to call here for any further questions they might have.

## 2018-02-20 ENCOUNTER — Other Ambulatory Visit: Payer: Self-pay

## 2018-02-20 ENCOUNTER — Encounter (HOSPITAL_BASED_OUTPATIENT_CLINIC_OR_DEPARTMENT_OTHER): Payer: Self-pay | Admitting: Emergency Medicine

## 2018-02-20 ENCOUNTER — Emergency Department (HOSPITAL_BASED_OUTPATIENT_CLINIC_OR_DEPARTMENT_OTHER): Payer: Medicaid Other

## 2018-02-20 ENCOUNTER — Emergency Department (HOSPITAL_BASED_OUTPATIENT_CLINIC_OR_DEPARTMENT_OTHER)
Admission: EM | Admit: 2018-02-20 | Discharge: 2018-02-20 | Disposition: A | Discharge status: Home / Self Care | Payer: Medicaid Other | Attending: Emergency Medicine | Admitting: Emergency Medicine

## 2018-02-20 DIAGNOSIS — R05 Cough: Secondary | ICD-10-CM | POA: Diagnosis present

## 2018-02-20 DIAGNOSIS — J069 Acute upper respiratory infection, unspecified: Secondary | ICD-10-CM | POA: Diagnosis not present

## 2018-02-20 MED ORDER — PHENYLEPHRINE-APAP-GUAIFENESIN 5-325-200 MG PO TABS: 2.0000 | ORAL_TABLET | Freq: Four times a day (QID) | ORAL | 0 refills | Status: DC | PRN

## 2018-02-20 MED ORDER — FLUTICASONE PROPIONATE 50 MCG/ACT NA SUSP: 1.0000 | Freq: Every day | NASAL | 0 refills | Status: DC

## 2018-02-20 NOTE — ED Provider Notes (Addendum)
[facility] EMERGENCY DEPARTMENT Provider Note   CSN: [id number] Arrival date & time: 02/20/18  1600     History   Chief Complaint Chief Complaint  Patient presents with  . Cough    HPI Dana Mcneil is a 27 y.o. female with a hx of cornelia de lange syndrome, generalized anxiety, PTSD, and MR who presents to the ED with her mother with complaints of URI symptoms for the past 3 to 4 days.  Patient states she is having congestion, sinus pain, sore throat, and cough that is sometimes productive with mucus sputum.  She states that her symptoms have been fairly constant without specific alleviating or aggravating factors.  She has tried Tylenol without significant change.  No other specific alleviating or aggravating factors.  Her mother at bedside states that she has seemed to have trouble getting mucus up. Her mother alos reports that she has had issues with aspiration before and is unsure if this could have occurred. Denies fever, shortness of breath, hemoptysis, chest pain, vomiting, or ear pain. Patient lives in group home facility.   HPI  Past Medical History:  Diagnosis Date  . [name]   . MR (mental retardation)     Patient Active Problem List   Diagnosis Date Noted  . 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 Irritability and anger 04/16/2013  . Aggression 04/16/2013  . PTSD (post-traumatic stress disorder) 04/16/2013  . GENERALIZED ANXIETY DISORDER 10/20/2007  . ESOPHAGEAL REFLUX 10/20/2007  . Dysphagia 10/20/2007  . DIARRHEA 10/20/2007    History reviewed. No pertinent surgical history.   OB History   None     Home Medications    Prior to Admission medications   Medication Sig Start Date End Date Taking? Authorizing Provider  benztropine (COGENTIN) 0.5 MG tablet Take 0.5 mg by mouth 2 (two) times daily.    [provider]  cetirizine (ZYRTEC) 10 MG tablet Take 10 mg by mouth daily.    [provider]    clonazePAM (KLONOPIN) 1 MG tablet Take 1 mg by mouth 3 (three) times daily.    [provider]  divalproex (DEPAKOTE) 250 MG DR tablet Take 750 mg by mouth at bedtime.     [provider]  fluticasone (FLONASE) 50 MCG/ACT nasal spray Place 2 sprays into both nostrils daily.    [provider]  OLANZapine (ZYPREXA) 15 MG tablet Take 30 mg by mouth daily.    [provider]  PRESCRIPTION MEDICATION MI Paste Plus. -- Apply pea size amount nightly with finger or Q tip after flossing and brushing until gone    [provider]  sertraline (ZOLOFT) 50 MG tablet Take 50 mg by mouth daily.    [provider]  traZODone (DESYREL) 150 MG tablet Take 300 mg by mouth at bedtime.    [provider]    Family History Family History  Problem Relation Age of Onset  . Diabetes Father   . Cancer Maternal Grandfather   . Hyperlipidemia Other   . Hypertension Other   . Colon cancer Neg Hx   . Colon polyps Neg Hx   . Kidney disease Neg Hx   . Esophageal cancer Neg Hx     Social History Social History   Tobacco Use  . Smoking status: Never Smoker  . Smokeless tobacco: Never Used  Substance Use Topics  . Alcohol use: No    Alcohol/week: 0.0 standard drinks  . Drug use: No  Allergies   Haldol [haloperidol lactate] and Lorazepam   Review of Systems Review of Systems  Constitutional: Negative for chills and fever.  HENT: Positive for congestion, rhinorrhea and sore throat. Negative for ear pain.   Respiratory: Positive for cough. Negative for shortness of breath.   Cardiovascular: Negative for chest pain and leg swelling.  Gastrointestinal: Negative for nausea and vomiting.     Physical Exam Updated Vital Signs BP 113/72 (BP Location: Left Arm)   Pulse (!) 120   Temp 98.4 F (36.9 C) (Oral)   Resp 20   Ht 5\' 2"  (1.575 m)   Wt 59.9 kg   BMI 24.16 kg/m   Physical Exam  Constitutional: She appears well-developed and  well-nourished. No distress.  HENT:  Head: Normocephalic and atraumatic.  Right Ear: Tympanic membrane is not perforated, not erythematous, not retracted and not bulging.  Left Ear: Tympanic membrane is not perforated, not erythematous, not retracted and not bulging.  Nose: Mucosal edema present. Right sinus exhibits maxillary sinus tenderness and frontal sinus tenderness. Left sinus exhibits maxillary sinus tenderness and frontal sinus tenderness.  Mouth/Throat: Uvula is midline and oropharynx is clear and moist. No oropharyngeal exudate or posterior oropharyngeal erythema.  Posterior oropharynx is symmetric appearing. Patient tolerating own secretions without difficulty. No trismus. No drooling. No hot potato voice. No swelling beneath the tongue, submandibular compartment is soft.   Eyes: Pupils are equal, round, and reactive to light. Conjunctivae are normal. Right eye exhibits no discharge. Left eye exhibits no discharge.  Neck: Normal range of motion. Neck supple.  Cardiovascular: Regular rhythm. Tachycardia present.  No murmur heard. Pulmonary/Chest: No respiratory distress. She has decreased breath sounds (mild bibasilar). She has no wheezes. She has no rhonchi. She has no rales.  Abdominal: Soft. She exhibits no distension. There is no tenderness.  Lymphadenopathy:    She has no cervical adenopathy.  Neurological: She is alert.  Skin: Skin is warm and dry. No rash noted.  Psychiatric: She has a normal mood and affect. Her behavior is normal.  Nursing note and vitals reviewed.   ED Treatments / Results  Labs (all labs ordered are listed, but only abnormal results are displayed) Labs Reviewed - No data to display  EKG None  Radiology Dg Chest 2 View  Result Date: 02/20/2018 CLINICAL DATA:  Cough and congestion for 3-4 days. EXAM: CHEST - 2 VIEW COMPARISON:  None. FINDINGS: Lung volumes are low with crowding of the bronchovascular structures. There are hazy bibasilar airspace  opacities. No pneumothorax or pleural effusion. Heart size is normal. No acute or focal bony abnormality. IMPRESSION: Hazy bibasilar airspace opacities are favored to represent atelectasis in this low volume chest. The exam is otherwise unremarkable. Electronically Signed   By: Drusilla Kanner M.D.   On: 02/20/2018 16:56   Procedures Procedures (including critical care time)  Medications Ordered in ED Medications - No data to display   Initial Impression / Assessment and Plan / ED Course  I have reviewed the triage vital signs and the nursing notes.  Pertinent labs & imaging results that were available during my care of the patient were reviewed by me and considered in my medical decision making (see chart for details).   Patient presents to the emergency department with her mother for URI symptoms over the past 3 to 4 days.  Patient nontoxic-appearing, no apparent distress, vitals WNL with the exception of tachycardia, improved throughout ER stay. Patient is afebrile in the ED, lungs without adventitious sounds, somewhat  decreased at the bases, CXR obtained with bibasilar atelectasis, no obvious infiltrate or findings to suggest pneumonia and/or aspiration. Patient is not hypoxic, no chest pain/dyspnea, doubt pulmonary embolism. She does not appear to be in respiratory distress.  Sxs onset < 7 days, afebrile,  doubt acute bacterial sinusitis. Centor score 0, doubt strep pharyngitis. No evidence of AOM on exam. No meningeal signs. No history components or rashes to raise concern for tic borne illness. Suspect viral vs allergic etiology at this time and will treat supportively with Flonase and Mucinex. I discussed results, treatment plan, need for PCP follow-up, and return precautions with the patient and her mother at bedside. Provided opportunity for questions, patient and her mother confirmed understanding and are in agreement with plan.    Findings and plan of care discussed with supervising  physician Dr. Anitra Lauth- in agreement.   Vitals:   02/20/18 1632 02/20/18 1814  BP:    Pulse: (!) 109 (!) 102  Resp: 20 20  Temp: 98.4 F (36.9 C)   SpO2: 100% 100%     Final Clinical Impressions(s) / ED Diagnoses   Final diagnoses:  URI with cough and congestion    ED Discharge Orders         Ordered    fluticasone (FLONASE) 50 MCG/ACT nasal spray  Daily     02/20/18 1806    Phenylephrine-APAP-guaiFENesin 5-325-200 MG TABS  Every 6 hours PRN     02/20/18 1806           Zanylah Hardie, [place] R, PA-C 02/20/18 1945    Cherly Anderson, PA-C 02/20/18 1946    Gwyneth Sprout, MD 02/21/18 2149

## 2018-02-20 NOTE — ED Triage Notes (Addendum)
Pt's reports pt has had congested cough x 3-4 days. Denies known fever. Pt lives in apartment with assistance. Her parents are her guardians

## 2018-02-20 NOTE — Discharge Instructions (Addendum)
You were seen in the emergency department today for upper respiratory infection type symptoms.  Your chest x-ray did not show signs of pneumonia or an aspiration pneumonia.  At this time we suspect your symptoms are viral in nature.  We are treating this supportively with Flonase and with Mucinex.  Please take each of these medications as prescribed to help with your symptoms.  We have prescribed you new medication(s) today. Discuss the medications prescribed today with your pharmacist as they can have adverse effects and interactions with your other medicines including over the counter and prescribed medications. Seek medical evaluation if you start to experience new or abnormal symptoms after taking one of these medicines, seek care immediately if you start to experience difficulty breathing, feeling of your throat closing, facial swelling, or rash as these could be indications of a more serious allergic reaction  We would like you to follow-up with a primary care provider within 5 days for reevaluation.  Return to the ER for new or worsening symptoms including but not limited to increased pain, fever, trouble breathing, inability to keep fluids down, or any other concerns that you may have.

## 2018-03-11 ENCOUNTER — Ambulatory Visit (INDEPENDENT_AMBULATORY_CARE_PROVIDER_SITE_OTHER): Payer: Medicaid Other | Admitting: Medical

## 2018-03-11 ENCOUNTER — Encounter: Payer: Self-pay | Admitting: Medical

## 2018-03-11 VITALS — BP 110/68 | HR 102 | Temp 97.4°F | Resp 16 | Ht 59.0 in | Wt 133.8 lb

## 2018-03-11 DIAGNOSIS — Z79899 Other long term (current) drug therapy: Secondary | ICD-10-CM

## 2018-03-11 DIAGNOSIS — Z13220 Encounter for screening for lipoid disorders: Secondary | ICD-10-CM | POA: Insufficient documentation

## 2018-03-11 DIAGNOSIS — Q8719 Other congenital malformation syndromes predominantly associated with short stature: Secondary | ICD-10-CM

## 2018-03-11 DIAGNOSIS — F419 Anxiety disorder, unspecified: Secondary | ICD-10-CM

## 2018-03-11 DIAGNOSIS — Z23 Encounter for immunization: Secondary | ICD-10-CM | POA: Diagnosis not present

## 2018-03-11 DIAGNOSIS — F79 Unspecified intellectual disabilities: Secondary | ICD-10-CM

## 2018-03-11 DIAGNOSIS — J301 Allergic rhinitis due to pollen: Secondary | ICD-10-CM | POA: Insufficient documentation

## 2018-03-11 DIAGNOSIS — Q899 Congenital malformation, unspecified: Secondary | ICD-10-CM | POA: Diagnosis not present

## 2018-03-11 DIAGNOSIS — Z131 Encounter for screening for diabetes mellitus: Secondary | ICD-10-CM

## 2018-03-11 DIAGNOSIS — R1310 Dysphagia, unspecified: Secondary | ICD-10-CM

## 2018-03-11 DIAGNOSIS — Z309 Encounter for contraceptive management, unspecified: Secondary | ICD-10-CM

## 2018-03-11 NOTE — Progress Notes (Signed)
Subjective: Chief Complaint  Patient presents with  . NP    NP flu shot er fu   Here with father and caregiver, Carmin Richmond, live in caregiver.  Emmaline lives with Carlos.   Her father is with her as well.  Was seeing [facility], [facility] prior Dr. Betti Cruz psychiatry [facility], Dr. Thomasene Ripple Dr. Darrol Jump, dentist [facility]   In general doing ok.  Has history of Cornella de Lange syndrome.  Had some congestion 3 weeks ago, was seen in the emergency dept.   Gets allergy problems this time of year.  taking zyrtec and Flonase daily.     Had swallow test with Dr. Rhetta Mura in past, but wants to try another as she seems to be having problems.  Has hx/o dysphasia, chokes sometimes.  This was worse in the past.    Walks daily for exercise, sometimes 2 miles.  Walks the dogs (3 dogs) in the neighborhood. 3 yorkies.  Sees psychiatry every 3 months, Dr. Betti Cruz  June - September can have some mood issues, hits at times.   Goes to day program M-F.  Past Medical History:  Diagnosis Date  . [name]   . MR (mental retardation)    Current Outpatient Medications on File Prior to Visit  Medication Sig Dispense Refill  . asenapine (SAPHRIS) 5 MG SUBL 24 hr tablet Place 5 mg under the tongue 2 (two) times daily.    . benztropine (COGENTIN) 0.5 MG tablet Take 0.5 mg by mouth 2 (two) times daily.    . cetirizine (ZYRTEC) 10 MG tablet Take 10 mg by mouth daily.    . clonazePAM (KLONOPIN) 1 MG tablet Take 1 mg by mouth 3 (three) times daily.    . divalproex (DEPAKOTE) 250 MG DR tablet Take 750 mg by mouth at bedtime.     Marland Kitchen doxepin (SINEQUAN) 75 MG capsule Take 75 mg by mouth.    . fluticasone (FLONASE) 50 MCG/ACT nasal spray Place 1 spray into both nostrils daily. 16 g 0  . OLANZapine (ZYPREXA) 15 MG tablet Take 30 mg by mouth daily.    Marland Kitchen Phenylephrine-APAP-guaiFENesin 5-325-200 MG TABS Take 2 tablets by mouth every 6 (six) hours as needed.  30 each 0  . PRESCRIPTION MEDICATION MI Paste Plus. -- Apply pea size amount nightly with finger or Q tip after flossing and brushing until gone    . sertraline (ZOLOFT) 50 MG tablet Take 50 mg by mouth daily.     No current facility-administered medications on file prior to visit.    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  Marland Kitchen Dysphagia, unspecified  type Yes  . [name]   . High risk medication use   . Congenital abnormalities   . Need for influenza vaccination   . Allergic rhinitis due to pollen, unspecified seasonality   . Anxiety   . Intellectual disability   . Encounter for contraceptive management, unspecified type   . Screening for diabetes mellitus   . Screening for lipid disorders     Plan: Reviewed her medication long, healthy history.    Counseled on the influenza virus vaccine.  Vaccine information sheet given.  Influenza vaccine given after consent obtained.  Labs today as below  Allergic rhinitis-continue Flonase and Zyrtec  We will set up for updated swallow study since she has been having some worse dysphagia problems of late.  She uses a straw for liquids, chew slowly with  small pieces of meat  Dad notes some pain however caregiver says she has really been complaining of pain.  We discussed having Tylenol on hand which is already the protocol at the home she lives in.  This is for acute pain.  She is already on other medicines that can help with pain such as doxepin and Depakote per psychiatry.  Of note chart history shows that she had been on Depo-Provera for contraception management.  We can review back over this next visit.  Her psychiatrist, Dr. Betti Cruz manages the bulk of her medications, and she has been seeing them for years.  Per dad and caregiver this is been a good relationship.   Delta was seen today for np.  Diagnoses and all orders for this visit:  Dysphagia, unspecified type -     SLP eval and treat; Future  [name] -     SLP eval and treat; Future  High risk medication use -     Comprehensive metabolic panel -     CBC with Differential/Platelet -     Valproic acid level -     Lipid panel -     Hemoglobin A1c  Congenital abnormalities  Need for influenza vaccination -     Flu Vaccine QUAD 6+ mos PF IM (Fluarix Quad PF)  Allergic rhinitis due to pollen, unspecified seasonality  Anxiety  Intellectual disability  Encounter for contraceptive management, unspecified type  Screening for diabetes mellitus -     Hemoglobin A1c  Screening for lipid disorders -     Lipid panel

## 2018-03-12 ENCOUNTER — Other Ambulatory Visit: Payer: Self-pay

## 2018-03-12 DIAGNOSIS — Q8719 Other congenital malformation syndromes predominantly associated with short stature: Secondary | ICD-10-CM

## 2018-03-12 DIAGNOSIS — R1310 Dysphagia, unspecified: Secondary | ICD-10-CM

## 2018-03-12 LAB — COMPREHENSIVE METABOLIC PANEL
ALBUMIN: 3.9 g/dL (ref 3.5–5.5)
ALT: 12 IU/L (ref 0–32)
AST: 14 IU/L (ref 0–40)
Albumin/Globulin Ratio: 1.6 (ref 1.2–2.2)
Alkaline Phosphatase: 44 IU/L (ref 39–117)
BUN/Creatinine Ratio: 16 (ref 9–23)
BUN: 17 mg/dL (ref 6–20)
Bilirubin Total: 0.4 mg/dL (ref 0.0–1.2)
CALCIUM: 9 mg/dL (ref 8.7–10.2)
CO2: 20 mmol/L (ref 20–29)
CREATININE: 1.07 mg/dL — AB (ref 0.57–1.00)
Chloride: 104 mmol/L (ref 96–106)
GFR calc Af Amer: 81 mL/min/{1.73_m2} (ref >59)
GFR calc non Af Amer: 70 mL/min/{1.73_m2} (ref >59)
Globulin, Total: 2.4 g/dL (ref 1.5–4.5)
Glucose: 72 mg/dL (ref 65–99)
Potassium: 4.3 mmol/L (ref 3.5–5.2)
Sodium: 140 mmol/L (ref 134–144)
Total Protein: 6.3 g/dL (ref 6.0–8.5)

## 2018-03-12 LAB — VALPROIC ACID LEVEL: VALPROIC ACID LVL: 81 ug/mL (ref 50–100)

## 2018-03-12 LAB — CBC WITH DIFFERENTIAL/PLATELET
Basophils Absolute: 0 10*3/uL (ref 0.0–0.2)
Basos: 0 %
EOS (ABSOLUTE): 0.2 10*3/uL (ref 0.0–0.4)
EOS: 3 %
HEMATOCRIT: 32.5 % — AB (ref 34.0–46.6)
HEMOGLOBIN: 10.9 g/dL — AB (ref 11.1–15.9)
Immature Grans (Abs): 0.1 10*3/uL (ref 0.0–0.1)
Immature Granulocytes: 1 %
Lymphocytes Absolute: 2.5 10*3/uL (ref 0.7–3.1)
Lymphs: 37 %
MCH: 31.7 pg (ref 26.6–33.0)
MCHC: 33.5 g/dL (ref 31.5–35.7)
MCV: 95 fL (ref 79–97)
MONOCYTES: 8 %
Monocytes Absolute: 0.6 10*3/uL (ref 0.1–0.9)
Neutrophils Absolute: 3.4 10*3/uL (ref 1.4–7.0)
Neutrophils: 51 %
Platelets: 154 10*3/uL (ref 150–450)
RBC: 3.44 x10E6/uL — ABNORMAL LOW (ref 3.77–5.28)
RDW: 12.4 % (ref 12.3–15.4)
WBC: 6.8 10*3/uL (ref 3.4–10.8)

## 2018-03-12 LAB — LIPID PANEL
CHOLESTEROL TOTAL: 183 mg/dL (ref 100–199)
Chol/HDL Ratio: 4.8 ratio — ABNORMAL HIGH (ref 0.0–4.4)
HDL: 38 mg/dL — ABNORMAL LOW (ref >39)
LDL Calculated: 123 mg/dL — ABNORMAL HIGH (ref 0–99)
TRIGLYCERIDES: 110 mg/dL (ref 0–149)
VLDL CHOLESTEROL CAL: 22 mg/dL (ref 5–40)

## 2018-03-12 LAB — HEMOGLOBIN A1C
Est. average glucose Bld gHb Est-mCnc: 88 mg/dL
Hgb A1c MFr Bld: 4.7 % — ABNORMAL LOW (ref 4.8–5.6)

## 2018-03-15 ENCOUNTER — Other Ambulatory Visit (HOSPITAL_COMMUNITY): Payer: Self-pay | Admitting: Medical

## 2018-03-15 DIAGNOSIS — R1310 Dysphagia, unspecified: Secondary | ICD-10-CM

## 2018-03-22 ENCOUNTER — Ambulatory Visit (HOSPITAL_COMMUNITY)
Admission: RE | Admit: 2018-03-22 | Discharge: 2018-03-22 | Disposition: A | Discharge status: Home / Self Care | Payer: Medicaid Other | Source: Ambulatory Visit | Attending: Medical | Admitting: Medical

## 2018-03-22 ENCOUNTER — Other Ambulatory Visit: Payer: Self-pay

## 2018-03-22 DIAGNOSIS — Q8719 Other congenital malformation syndromes predominantly associated with short stature: Secondary | ICD-10-CM | POA: Diagnosis not present

## 2018-03-22 DIAGNOSIS — R1312 Dysphagia, oropharyngeal phase: Secondary | ICD-10-CM | POA: Insufficient documentation

## 2018-03-22 DIAGNOSIS — R1310 Dysphagia, unspecified: Secondary | ICD-10-CM | POA: Diagnosis present

## 2018-03-22 DIAGNOSIS — F79 Unspecified intellectual disabilities: Secondary | ICD-10-CM | POA: Insufficient documentation

## 2018-03-22 NOTE — Progress Notes (Signed)
Modified Barium Swallow Progress Note  Patient Details  Name: Dana Mcneil MRN: [id number] Date of Birth: 1990/07/24  Today's Date: 03/22/2018  Modified Barium Swallow completed.  Full report located under Chart Review in the Imaging Section.  Brief recommendations include the following:  Clinical Impression  Pt exhibited mild oropharyngeal dysphagia marked by oral residue and reduced manipulation/mastication of solids as well as silent aspiration with thin without implementation of chin tuck maneuver due to incoordination. Fortunately, consistent implementation of chin tuck was effective to provide additional airway closure with all further thin liquid trials, as only 1 instance of flash penetration was noted. Puree and regular were consumed without airway intrusion, however reduced lingual manipulation, insufficient mastication, and mild cricopharyngeal segment and pyriform sinus residue was observed. Evidence of mild-mod pharyngoesophageal dysphagia was also evident across consistencies with partial backflow of boluses into the pyriform sinuses suspected due to cricopharyngeal dysfunction (decreased pressure and/or premature closure of UES). Although MBSS does not diagnose below the level of the upper esophageal sphincter, a brief scan of the esophagus revealed noteable stasis and pt may benefit from further esophageal assessment. Suspect increased rate of consumption, oral pocketing and large, consecutive sips of thin contribute to parents/caregiver report of frequent pocketing and coughing with thins at home. Recommend continue regular diet, thin liquids with use of chin tuck maneuver , slow rate, small bites/sips, minimize environmental distractions, intermittent throat clear throughout meals, and extra dry swallow after each bite/sip to clear pharyngeal residue. Reiterated importance of slow rate and smaller volumes and tactile cues to remove cup/straw if needed. Pt will require full  supervision during meals to facilitate use of these recommended strategies for safest swallow, however no further ST is indicated at this time.   Swallow Evaluation Recommendations   Recommended Consults: Consider esophageal assessment   SLP Diet Recommendations: Thin liquid;Regular solids;Other (Comment)(use chin tuck)   Liquid Administration via: Cup;Straw   Medication Administration: Whole meds with puree   Supervision: Patient able to self feed;Full supervision/cueing for compensatory strategies   Compensations: Minimize environmental distractions;Slow rate;Small sips/bites;Lingual sweep for clearance of pocketing;Chin tuck;Use straw to facilitate chin tuck;Multiple dry swallows after each bite/sip;Clear throat intermittently       Oral Care Recommendations: Oral care BID        Royce Macadamia 03/22/2018,4:32 PM   Breck Coons [facility].Ed Nurse, children's [phone] Office [phone]

## 2018-03-23 ENCOUNTER — Other Ambulatory Visit: Payer: Self-pay

## 2018-03-23 DIAGNOSIS — R1310 Dysphagia, unspecified: Secondary | ICD-10-CM

## 2018-03-23 DIAGNOSIS — R933 Abnormal findings on diagnostic imaging of other parts of digestive tract: Secondary | ICD-10-CM

## 2018-03-23 DIAGNOSIS — Q8719 Other congenital malformation syndromes predominantly associated with short stature: Secondary | ICD-10-CM

## 2018-04-12 ENCOUNTER — Other Ambulatory Visit: Payer: Self-pay

## 2018-04-12 ENCOUNTER — Ambulatory Visit: Payer: Medicaid Other | Attending: Medical | Admitting: Speech Pathology

## 2018-04-12 ENCOUNTER — Encounter: Payer: Self-pay | Admitting: Speech Pathology

## 2018-04-12 DIAGNOSIS — R1312 Dysphagia, oropharyngeal phase: Secondary | ICD-10-CM

## 2018-04-12 NOTE — Patient Instructions (Signed)
  Regular diet, thin liquids  Tuck chin when you swallow: all the way down (chin to chest). Try a straight straw to help. One sip at a time.  Small bites. Make sure you chew, and make sure your mouth is empty before you put more food in. Swallow 2 times. Alternate bites of food and sips of liquid.   Go slow. (Try counting to 3 or 5 AFTER you swallow before taking another bite.)  If your voice sounds gurgly, clear your throat and swallow again.   Signs of Aspiration Pneumonia   . Chest pain/tightness . Fever (can be low grade) . Cough  o With foul-smelling phlegm (sputum) o With sputum containing pus or blood o With greenish sputum . Fatigue  . Shortness of breath  . Wheezing   **IF YOU HAVE THESE SIGNS, CONTACT YOUR DOCTOR OR GO TO THE EMERGENCY DEPARTMENT OR URGENT CARE AS SOON AS POSSIBLE**

## 2018-04-12 NOTE — Therapy (Signed)
[facility] [address] [place], [place], [address] Phone: [phone]   Fax:  [phone]  Speech Language Pathology Evaluation  Patient Details  Name: Dana Mcneil MRN: [id number] Date of Birth: 07-23-1990 Referring Provider (SLP): Dr. Crosby Oysteravid Tysinger   Encounter Date: 04/12/2018  End of Session - 04/12/18 1927    Visit Number  1    Number of Visits  1    Date for SLP Re-Evaluation  04/12/18    Authorization Type  Medicaid    SLP Start Time  1445    SLP Stop Time   1532    SLP Time Calculation (min)  47 min    Activity Tolerance  Patient tolerated treatment well       Past Medical History:  Diagnosis Date  . [name]   . MR (mental retardation)     History reviewed. No pertinent surgical history.  There were no vitals filed for this visit.  Subjective Assessment - 04/12/18 1451    Subjective  "Good." pt, re: swallowing. (mom reports pt coughing when drinking water in waiting area.)    Currently in Pain?  No/denies         SLP Evaluation [facility] - 04/12/18 1451      SLP Visit Information   SLP Received On  04/12/18    Referring Provider (SLP)  Dr. Crosby Oysteravid Tysinger    Onset Date  referral 03/23/18   congenital deficits   Medical Diagnosis  cornelia de lange syndrome, dysphagia      Subjective   Subjective  Pt arrives with mother and father    Patient/Family Stated Goal  for pt to eat and drink safely, improve swallowing if it is possible      General Information   HPI  Pt is a 27 y.o. female with hx of cornelia de lange syndrome, generalized anxiety, PTSD, MR, GERD and dysphagia. Pt seen 05/02/2014 for MBS - recc D3/nectar, repeat MBS completed 03/27/2015 - recc D2/nectar. Also participated in outpatient SLP services 05/31/2014-06/28/2014. Most recent MBS on 03/22/18 with findings of mild oropharyngeal dysphagia marked by oral residue and reduced manipulation/mastication of solids as  well as silent aspiration with thin without implementation of chin tuck maneuver due to incoordination. Consistent implementation of chin tuck was shown to be effective for airway protection with thin liquids. Regular diet and thin liquids was recommended with precautions/compensations, and no recommendations for further ST services though GI workup was recommended.    Behavioral/Cognition  pleasant, alert, cooperative    Mobility Status  ambulated to session      Balance Screen   Has the patient fallen in the past 6 months  No    Has the patient had a decrease in activity level because of a fear of falling?   No    Is the patient reluctant to leave their home because of a fear of falling?   No      Prior Functional Status   Cognitive/Linguistic Baseline  Baseline deficits    Baseline deficit details  intellectual disability    Type of Home  Assisted living     Lives With  --   [facility]giver [facility]   Available Support  Family;Available 24 hours/day;Other (Comment)   caregiver   Vocation  On disability      Pain Assessment   Pain Assessment  Faces    Faces Pain Scale  No hurt      Cognition   Overall Cognitive Status  History of cognitive impairments - at baseline      Auditory Comprehension   Overall Auditory Comprehension  Appears within functional limits for tasks assessed      Expression   Primary Mode of Expression  Verbal      Verbal Expression   Overall Verbal Expression  Impaired at baseline      Written Expression   Written Expression  Not tested      Oral Motor/Sensory Function   Overall Oral Motor/Sensory Function  Other (comment)   generalized oral weakness     Standardized Assessments   Standardized Assessments   Other Assessment    Other Assessment  Mother noted pt with coughing when drinking water prior to entering ST room, however noted that this was when drinking from a water fountain, which would make it difficult for pt to use recommended precautions of chin  tuck, single sips. Mother agreed. SLP suggested filling a cup vs. using a fountain. SLP provided skilled observation as pt consumed regular solids, purees and thin liquids with mother providing appropriate cues for precautions, including thorough mastication, small bites/sips, chin tuck, and inspecting pt's oral cavity for pocketing/residue. Vocal quality remained clear, and pt did not display any overt signs of aspiration. Caregiver [name] arrived and explained procedure for cuing pt, which was appropriate. SLP reinforced precautions/recommendations from [facility] and [name] proposed appropriate idea to help pt slow her rate of intake.                       SLP Education - 04/12/18 1926    Education Details  Swallow precautions/compensations and cuing strategies, signs of aspiration pneumonia    Person(s) Educated  Patient;Parent(s);Caregiver(s)    Methods  Explanation;Handout    Comprehension  Verbalized understanding           Plan - 04/12/18 1927    Clinical Impression Statement  Patient presents with mild oropharyngeal dysphagia per MBS on 03/22/18. Pt seen for evaluation with mother and father present today, questioning whether pt would benefit from skilled ST services to improve her swallowing. Caregiver [name] joined at the end of the session. Mother reports concern about pt's being "congested all the time," and wonders if there is anything else that can be done for her swallowing. SLP reviewed MBS findings and recommendations with pt, mother and father. Compensatory maneuvers (chin tuck, multiple swallows to clear residue, small bites and sips) were shown to be effective for airway protection with regular solids and thin liquids per MBS. Given chronic nature of pt's swallowing problem, pt's difficulty performing basic oral-motor tasks during prior course of ST, timing/coordination issues, as well as multifactorial components of dysphagia (in addition to oropharyngeal deficits,  cervical-esophageal backflow, possibly impacted by structure/positioning), swallowing exercises are not likely to be beneficial to pt. Pt's parents and caregiver demonstrate good understanding of pt's swallowing precautions and compensatory maneuvers which were shown to provide adequate airway protection with regular diet and thin liquids. Pt will require supervision and assistance for all meals due to level of cuing required to maintain slow rate of intake, monitor for pocketing, ensure small bites/sips and use of chin tuck, multiple swallows. SLP reinforced precautions and provided education re: signs of aspiration pneumonia. Pt/caregiver questions were answered and no further skilled ST is recommended at this time.    Speech Therapy Frequency  One time visit    Duration  --   evaluation only   Treatment/Interventions  Aspiration precaution training;Diet toleration management by SLP;SLP instruction  and feedback;Patient/family education    Potential to Achieve Goals  Good    Potential Considerations  Family/community support;Previous level of function;Ability to learn/carryover information    SLP Home Exercise Plan  swallowing precautions reviewed/provided in handout form    Consulted and Agree with Plan of Care  Patient;Family member/caregiver   mother, father, and caregiver [name]      Patient will benefit from skilled therapeutic intervention in order to improve the following deficits and impairments:   Dysphagia, oropharyngeal phase    Problem List Patient Active Problem List   Diagnosis Date Noted  . High risk medication use 03/11/2018  . 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 Need for influenza vaccination 03/11/2018  . Allergic rhinitis due to pollen 03/11/2018  . Anxiety 03/11/2018  . Screening for lipid disorders 03/11/2018  . 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 Intellectual disability 04/16/2013  . Irritability and anger 04/16/2013  . Aggression 04/16/2013  . PTSD  (post-traumatic stress disorder) 04/16/2013  . GENERALIZED ANXIETY DISORDER 10/20/2007  . ESOPHAGEAL REFLUX 10/20/2007  . Dysphagia 10/20/2007  . DIARRHEA 10/20/2007   Rondel Baton, MS, CCC-SLP Speech-Language Pathologist   Arlana Lindau 04/12/2018, 7:34 PM  [facility] [address] [place], [place], [address] Phone: [phone]   Fax:  [phone]  Name: Dana Mcneil MRN: [id number] Date of Birth: February 09, 1991

## 2018-04-28 ENCOUNTER — Ambulatory Visit: Payer: Self-pay | Admitting: Medical

## 2018-04-28 ENCOUNTER — Encounter: Payer: Self-pay | Admitting: Medical

## 2018-04-28 ENCOUNTER — Ambulatory Visit (INDEPENDENT_AMBULATORY_CARE_PROVIDER_SITE_OTHER): Payer: Medicaid Other | Admitting: Medical

## 2018-04-28 VITALS — BP 114/79 | HR 111 | Temp 98.1°F | Resp 18 | Ht 59.0 in | Wt 135.4 lb

## 2018-04-28 DIAGNOSIS — J0110 Acute frontal sinusitis, unspecified: Secondary | ICD-10-CM | POA: Diagnosis not present

## 2018-04-28 MED ORDER — GUAIFENESIN ER 600 MG PO TB12: 600.0000 mg | ORAL_TABLET | Freq: Two times a day (BID) | ORAL | 0 refills | Status: DC

## 2018-04-28 MED ORDER — AMOXICILLIN 875 MG PO TABS: 875.0000 mg | ORAL_TABLET | Freq: Two times a day (BID) | ORAL | 0 refills | Status: DC

## 2018-04-28 NOTE — Patient Instructions (Signed)
Recommendations:  Drink plenty of water throughout the day  I recommend using nasal saline spray into the nostril wait 1 minute did suction out mucus with a bulb syringe as needed  You can use Mucinex plain twice daily for 5 days for congestion  Begin amoxicillin antibiotic for the next 10 days  If not much improved within the next week then recheck

## 2018-04-28 NOTE — Progress Notes (Signed)
Subjective: Chief Complaint  Patient presents with  . sinus issue    cough, congestion, runny nose X Sunday   Here for illness.   Accompanied by father and new case worker Dana Mcneil.  History provided by all 3 persons in the room.  She reports begin stuffy in chest and head x 4 days, runny nose, hard to breath through nose at night, some sore throat,some ear discomfort.  No fever.  No NVD.  Has had several sick contacts.   coughing a lot.  Has used some benadryl.  No other aggravating or relieving factors. No other complaint.  Past Medical History:  Diagnosis Date  . [name]   . MR (mental retardation)     Current Outpatient Medications on File Prior to Visit  Medication Sig Dispense Refill  . asenapine (SAPHRIS) 5 MG SUBL 24 hr tablet Place 5 mg under the tongue 2 (two) times daily.    . benztropine (COGENTIN) 0.5 MG tablet Take 0.5 mg by mouth 2 (two) times daily.    . cetirizine (ZYRTEC) 10 MG tablet Take 10 mg by mouth daily.    . Cholecalciferol (VITAMIN D3) 2000 units capsule Take 2,000 Units by mouth daily.    . clonazePAM (KLONOPIN) 1 MG tablet Take 1 mg by mouth 3 (three) times daily.    . divalproex (DEPAKOTE) 250 MG DR tablet Take 750 mg by mouth at bedtime.     Marland Kitchen. doxepin (SINEQUAN) 75 MG capsule Take 75 mg by mouth.    . fluticasone (FLONASE) 50 MCG/ACT nasal spray Place 1 spray into both nostrils daily. 16 g 0  . OLANZapine (ZYPREXA) 15 MG tablet Take 30 mg by mouth daily.    Marland Kitchen. omeprazole (PRILOSEC) 40 MG capsule Take 40 mg by mouth daily.    Marland Kitchen. PRESCRIPTION MEDICATION MI Paste Plus. -- Apply pea size amount nightly with finger or Q tip after flossing and brushing until gone    . sertraline (ZOLOFT) 50 MG tablet Take 100 mg by mouth daily.     Marland Kitchen. Phenylephrine-APAP-guaiFENesin 5-325-200 MG TABS Take 2 tablets by mouth every 6 (six) hours as needed. (Patient not taking: Reported on 04/28/2018) 30 each 0   No current facility-administered medications on  file prior to visit.     ROS as in subjective   Objective: BP 114/79   Pulse (!) 111   Temp 98.1 F (36.7 C) (Oral)   Resp 18   Ht 4\' 11"  (1.499 m)   Wt 135 lb 6.4 oz (61.4 kg)   SpO2 98%   BMI 27.35 kg/m   General appearance: alert, no distress, WD/WN,  HEENT: normocephalic, sclerae anicteric, TMs pearly, nares with moderate turbinated edema, thick mucoid discharge, + erythema, pharynx normal Oral cavity: MMM, no lesions Neck: supple, no lymphadenopathy, no thyromegaly, no masses Lungs: CTA bilaterally, no wheezes, rhonchi, or rales    Assessment: Encounter Diagnosis  Name Primary?  . Acute non-recurrent frontal sinusitis Yes      Plan: Discussed findings, recommendations  Patient Instructions  Recommendations:  Drink plenty of water throughout the day  I recommend using nasal saline spray into the nostril wait 1 minute did suction out mucus with a bulb syringe as needed  You can use Mucinex plain twice daily for 5 days for congestion  Begin amoxicillin antibiotic for the next 10 days  If not much improved within the next week then recheck   Dana Mcneil was seen today for sinus issue.  Diagnoses and all orders for  this visit:  Acute non-recurrent frontal sinusitis  Other orders -     amoxicillin (AMOXIL) 875 MG tablet; Take 1 tablet (875 mg total) by mouth 2 (two) times daily. -     guaiFENesin (MUCINEX) 600 MG 12 hr tablet; Take 1 tablet (600 mg total) by mouth 2 (two) times daily.

## 2018-05-07 ENCOUNTER — Telehealth: Payer: Self-pay | Admitting: Medical

## 2018-05-07 NOTE — Telephone Encounter (Signed)
Completed FMLA for mother on her behalf

## 2018-05-07 NOTE — Telephone Encounter (Signed)
Faxed FMLA paperwork. 

## 2018-05-24 ENCOUNTER — Telehealth: Payer: Self-pay

## 2018-05-24 NOTE — Telephone Encounter (Signed)
Received FMLA from [facility] for patient's mother and it was filled out and faxed back to them.

## 2018-05-25 ENCOUNTER — Telehealth: Payer: Self-pay

## 2018-05-25 NOTE — Telephone Encounter (Signed)
Lets refer to counseling, try [facility] or [facility], unless her new psychiatry office has counselors?   Has she established with new psychiatrist ?
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
## 2018-05-26 ENCOUNTER — Other Ambulatory Visit: Payer: Self-pay

## 2018-05-26 DIAGNOSIS — R454 Irritability and anger: Secondary | ICD-10-CM

## 2018-05-26 DIAGNOSIS — F4310 Post-traumatic stress disorder, unspecified: Secondary | ICD-10-CM

## 2018-05-26 DIAGNOSIS — F411 Generalized anxiety disorder: Secondary | ICD-10-CM

## 2018-05-26 DIAGNOSIS — F419 Anxiety disorder, unspecified: Secondary | ICD-10-CM

## 2018-05-26 DIAGNOSIS — Q899 Congenital malformation, unspecified: Secondary | ICD-10-CM

## 2018-05-26 NOTE — Telephone Encounter (Signed)
Referral sent to Tommi Emery.

## 2018-06-18 ENCOUNTER — Ambulatory Visit (INDEPENDENT_AMBULATORY_CARE_PROVIDER_SITE_OTHER): Payer: Medicaid Other | Admitting: Medical

## 2018-06-18 ENCOUNTER — Encounter: Payer: Self-pay | Admitting: Medical

## 2018-06-18 VITALS — BP 110/80 | HR 101 | Temp 97.8°F | Resp 16 | Ht 59.0 in | Wt 149.8 lb

## 2018-06-18 DIAGNOSIS — R609 Edema, unspecified: Secondary | ICD-10-CM | POA: Diagnosis not present

## 2018-06-18 DIAGNOSIS — R140 Abdominal distension (gaseous): Secondary | ICD-10-CM | POA: Diagnosis not present

## 2018-06-18 DIAGNOSIS — D649 Anemia, unspecified: Secondary | ICD-10-CM | POA: Diagnosis not present

## 2018-06-18 DIAGNOSIS — K5900 Constipation, unspecified: Secondary | ICD-10-CM | POA: Diagnosis not present

## 2018-06-18 DIAGNOSIS — Z79899 Other long term (current) drug therapy: Secondary | ICD-10-CM

## 2018-06-18 LAB — POCT URINALYSIS DIP (PROADVANTAGE DEVICE)
Bilirubin, UA: NEGATIVE
Blood, UA: NEGATIVE
GLUCOSE UA: NEGATIVE mg/dL
Ketones, POC UA: NEGATIVE mg/dL
LEUKOCYTES UA: NEGATIVE
NITRITE UA: NEGATIVE
Protein Ur, POC: NEGATIVE mg/dL
SPECIFIC GRAVITY, URINE: 1.03
Urobilinogen, Ur: NEGATIVE
pH, UA: 6 (ref 5.0–8.0)

## 2018-06-18 MED ORDER — POLYETHYLENE GLYCOL 3350 17 GM/SCOOP PO POWD: ORAL | 2 refills | Status: DC

## 2018-06-18 NOTE — Patient Instructions (Signed)
Encounter Diagnoses  Name Primary?  Dana Mcneil. Anemia, unspecified type Yes  . Bloating   . Constipation, unspecified constipation type   . Edema, unspecified type   . High risk medication use     Recommendations: Anemia/low blood count- I am checking some additional labs today regarding low blood count.  Her blood count was slightly low last visit   Bloating, constipation- begin MiraLAX powder, 1 capful daily in a beverage in the morning.  Make sure she is drinking plenty of water throughout the day, try to get fiber in the diet such as whole grain pasta or oatmeal or quinoa or other whole grains as well as green leafy vegetables  Swelling of the legs which is new- We are checking her kidney labs today I do recommend she exercise such as walking for 30 minutes a day I recommend she limit salty foods such as soda, chips, hot dogs, pickles, lots of bread, candy and junk food  We will call Monday with lab results

## 2018-06-18 NOTE — Progress Notes (Signed)
Subjective: Chief Complaint  Patient presents with  . feet swelling    feet swelling X 1 week    Here today with her father, her caregiver and a female relative. History provided by the family and caregiver  Recently they have noticed gradual swelling of her legs.  She denies shortness of breath or chest pain.  She drinks a lot of fluids but does not urinate very much and proportion to note.  She denies blood in urine.  No belly or back pain.  But they noticed her abdomen looks larger than in the past.  She is on some new medications from her psychiatrist  She does eat a fair amount of salt.  She does eat chips regularly.  Not exercising.  She drinks a good amount of water but does drink some Sprite  Regarding anemia on last labs, they deny blood in the urine or stool, no vaginal bleeding, no history of anemia.  Her caregiver does note that she states she is cold sometimes.  No ice craving.  She urinates less than they think she should although she drinks a fair amount.  She has bowel movements on average every 3 days.  They wonder about constipation.  She has looked more bloated lately.  She has had some upset stomach as well.  Diet recall:  Typical breakfast Oatmeal banana and pop tarts, or Gravy and Biscuits, or Eggs, Cheese [employer]danish  Lunch  Pasta cheese, margarine, or Cottage cheese and fruit, quesidella, Pimento cheese sandwich and chips  Dinner [place] mashed potatoes, green beans or Salads, or Hot dogs   Past Medical History:  Diagnosis Date  . [name]   . MR (mental retardation)    Current Outpatient Medications on File Prior to Visit  Medication Sig Dispense Refill  . asenapine (SAPHRIS) 5 MG SUBL 24 hr tablet Place 5 mg under the tongue 2 (two) times daily.    . benztropine (COGENTIN) 0.5 MG tablet Take 0.5 mg by mouth 2 (two) times daily.    . Cholecalciferol (VITAMIN D3) 2000 units capsule Take 2,000 Units by mouth daily.    . clonazePAM (KLONOPIN) 1  MG tablet Take 1 mg by mouth 3 (three) times daily.    . divalproex (DEPAKOTE) 250 MG DR tablet Take 750 mg by mouth at bedtime.     Marland Kitchen. doxepin (SINEQUAN) 75 MG capsule Take 75 mg by mouth.    . fluticasone (FLONASE) 50 MCG/ACT nasal spray Place 1 spray into both nostrils daily. 16 g 0  . omeprazole (PRILOSEC) 40 MG capsule Take 40 mg by mouth daily.    Marland Kitchen. PRESCRIPTION MEDICATION MI Paste Plus. -- Apply pea size amount nightly with finger or Q tip after flossing and brushing until gone    . sertraline (ZOLOFT) 50 MG tablet Take 100 mg by mouth daily.     . cetirizine (ZYRTEC) 10 MG tablet Take 10 mg by mouth daily.    Marland Kitchen. guaiFENesin (MUCINEX) 600 MG 12 hr tablet Take 1 tablet (600 mg total) by mouth 2 (two) times daily. (Patient not taking: Reported on 06/18/2018) 10 tablet 0  . Phenylephrine-APAP-guaiFENesin 5-325-200 MG TABS Take 2 tablets by mouth every 6 (six) hours as needed. (Patient not taking: Reported on 04/28/2018) 30 each 0   No current facility-administered medications on file prior to visit.    ROS as in subjective    Objective: BP 110/80   Pulse (!) 101   Temp 97.8 F (36.6 C) (Oral)  Resp 16   Ht 4\' 11"  (1.499 m)   Wt 149 lb 12.8 oz (67.9 kg)   SpO2 97%   BMI 30.26 kg/m    Wt Readings from Last 3 Encounters:  06/18/18 149 lb 12.8 oz (67.9 kg)  04/28/18 135 lb 6.4 oz (61.4 kg)  03/11/18 133 lb 12.8 oz (60.7 kg)   General appearance: alert, no distress, WD/WN,  oral cavity: MMM, no lesions Neck: supple, no lymphadenopathy, no thyromegaly, no masses, no JVD Heart: RRR, normal S1, S2, no murmurs Lungs: CTA bilaterally, no wheezes, rhonchi, or rales Abdomen: +bs, soft, non tender, non distended, no masses, no hepatomegaly, no splenomegaly Pulses: 2+ symmetric, upper and lower extremities, normal cap refill Ext: 1+ pitting lower extremity edema from mid lower leg to feet, cap refill right foot slightly reduced compared to left foot but still  WNL    Assessment: Encounter Diagnoses  Name Primary?  Marland Kitchen Anemia, unspecified type Yes  . Bloating   . Constipation, unspecified constipation type   . Edema, unspecified type   . High risk medication use      Plan: We discussed concerns, exam findings, possible causes of her edema which I suspect is more salt in the diet related.  She also is not exercising.  We discussed anemia on last lab results.  Also discussed weight gain in part likely due to the type of medication she is taking for mental health.  Discussed the following recommendations  Recommendations: Anemia/low blood count- I am checking some additional labs today regarding low blood count.  Her blood count was slightly low last visit  Bloating, constipation- begin MiraLAX powder, 1 capful daily in a beverage in the morning.  Make sure she is drinking plenty of water throughout the day, try to get fiber in the diet such as whole grain pasta or oatmeal or quinoa or other whole grains as well as green leafy vegetables  Swelling of the legs which is new- We are checking her kidney labs today I do recommend she exercise such as walking for 30 minutes a day I recommend she limit salty foods such as soda, chips, hot dogs, pickles, lots of bread, candy and junk food  We will call Monday with lab results   [name] was seen today for feet swelling.  Diagnoses and all orders for this visit:  Anemia, unspecified type -     CBC -     Iron and TIBC -     Vitamin B12 -     Folate  Bloating -     Comprehensive metabolic panel -     CBC  Constipation, unspecified constipation type  Edema, unspecified type -     Comprehensive metabolic panel -     TSH -     POCT Urinalysis DIP (Proadvantage Device)  High risk medication use -     Comprehensive metabolic panel -     CBC -     TSH  Other orders -     polyethylene glycol powder (GLYCOLAX/MIRALAX) powder; 1 cap full daily in a beverage in the morning

## 2018-06-19 LAB — COMPREHENSIVE METABOLIC PANEL
A/G RATIO: 1.6 (ref 1.2–2.2)
ALBUMIN: 4.4 g/dL (ref 3.9–5.0)
ALT: 12 IU/L (ref 0–32)
AST: 14 IU/L (ref 0–40)
Alkaline Phosphatase: 61 IU/L (ref 39–117)
BUN / CREAT RATIO: 22 (ref 9–23)
BUN: 20 mg/dL (ref 6–20)
Bilirubin Total: <0.2 mg/dL (ref 0.0–1.2)
CALCIUM: 9.1 mg/dL (ref 8.7–10.2)
CO2: 21 mmol/L (ref 20–29)
Chloride: 104 mmol/L (ref 96–106)
Creatinine, Ser: 0.93 mg/dL (ref 0.57–1.00)
GFR, EST AFRICAN AMERICAN: 96 mL/min/{1.73_m2} (ref >59)
GFR, EST NON AFRICAN AMERICAN: 83 mL/min/{1.73_m2} (ref >59)
Globulin, Total: 2.7 g/dL (ref 1.5–4.5)
Glucose: 70 mg/dL (ref 65–99)
Potassium: 4.2 mmol/L (ref 3.5–5.2)
Sodium: 144 mmol/L (ref 134–144)
TOTAL PROTEIN: 7.1 g/dL (ref 6.0–8.5)

## 2018-06-19 LAB — IRON AND TIBC
Iron Saturation: 13 % — ABNORMAL LOW (ref 15–55)
Iron: 50 ug/dL (ref 27–159)
Total Iron Binding Capacity: 375 ug/dL (ref 250–450)
UIBC: 325 ug/dL (ref 131–425)

## 2018-06-19 LAB — CBC
HEMATOCRIT: 36.1 % (ref 34.0–46.6)
HEMOGLOBIN: 12.1 g/dL (ref 11.1–15.9)
MCH: 32.4 pg (ref 26.6–33.0)
MCHC: 33.5 g/dL (ref 31.5–35.7)
MCV: 97 fL (ref 79–97)
Platelets: 173 10*3/uL (ref 150–450)
RBC: 3.73 x10E6/uL — ABNORMAL LOW (ref 3.77–5.28)
RDW: 12.6 % (ref 11.7–15.4)
WBC: 6.6 10*3/uL (ref 3.4–10.8)

## 2018-06-19 LAB — VITAMIN B12: VITAMIN B 12: 259 pg/mL (ref 232–1245)

## 2018-06-19 LAB — TSH: TSH: 2.89 u[IU]/mL (ref 0.450–4.500)

## 2018-06-19 LAB — FOLATE: FOLATE: 6.1 ng/mL (ref >3.0)

## 2018-06-21 ENCOUNTER — Other Ambulatory Visit: Payer: Self-pay | Admitting: Medical

## 2018-06-21 MED ORDER — OMEPRAZOLE 40 MG PO CPDR: 40.0000 mg | DELAYED_RELEASE_CAPSULE | Freq: Every day | ORAL | 0 refills | Status: DC

## 2018-06-21 MED ORDER — HYDROCHLOROTHIAZIDE 12.5 MG PO CAPS: 12.5000 mg | ORAL_CAPSULE | Freq: Every day | ORAL | 1 refills | Status: DC

## 2018-07-20 ENCOUNTER — Ambulatory Visit (INDEPENDENT_AMBULATORY_CARE_PROVIDER_SITE_OTHER): Payer: Medicaid Other | Admitting: Medical

## 2018-07-20 ENCOUNTER — Telehealth: Payer: Self-pay | Admitting: Internal Medicine

## 2018-07-20 VITALS — BP 120/76 | HR 118 | Temp 98.5°F | Resp 16 | Wt 151.6 lb

## 2018-07-20 DIAGNOSIS — Q8719 Other congenital malformation syndromes predominantly associated with short stature: Secondary | ICD-10-CM

## 2018-07-20 DIAGNOSIS — R000 Tachycardia, unspecified: Secondary | ICD-10-CM | POA: Insufficient documentation

## 2018-07-20 DIAGNOSIS — D649 Anemia, unspecified: Secondary | ICD-10-CM

## 2018-07-20 DIAGNOSIS — R609 Edema, unspecified: Secondary | ICD-10-CM

## 2018-07-20 DIAGNOSIS — Z79899 Other long term (current) drug therapy: Secondary | ICD-10-CM | POA: Diagnosis not present

## 2018-07-20 DIAGNOSIS — K5900 Constipation, unspecified: Secondary | ICD-10-CM | POA: Diagnosis not present

## 2018-07-20 DIAGNOSIS — R0683 Snoring: Secondary | ICD-10-CM

## 2018-07-20 DIAGNOSIS — R011 Cardiac murmur, unspecified: Secondary | ICD-10-CM

## 2018-07-20 DIAGNOSIS — E669 Obesity, unspecified: Secondary | ICD-10-CM | POA: Insufficient documentation

## 2018-07-20 DIAGNOSIS — R519 Headache, unspecified: Secondary | ICD-10-CM | POA: Insufficient documentation

## 2018-07-20 DIAGNOSIS — R300 Dysuria: Secondary | ICD-10-CM | POA: Insufficient documentation

## 2018-07-20 DIAGNOSIS — R51 Headache: Secondary | ICD-10-CM

## 2018-07-20 DIAGNOSIS — F79 Unspecified intellectual disabilities: Secondary | ICD-10-CM

## 2018-07-20 LAB — POCT URINALYSIS DIP (PROADVANTAGE DEVICE)
Bilirubin, UA: NEGATIVE
Blood, UA: NEGATIVE
Glucose, UA: NEGATIVE mg/dL
Ketones, POC UA: NEGATIVE mg/dL
LEUKOCYTES UA: NEGATIVE
NITRITE UA: NEGATIVE
Protein Ur, POC: NEGATIVE mg/dL
Specific Gravity, Urine: 1.02
Urobilinogen, Ur: NEGATIVE
pH, UA: 6.5 (ref 5.0–8.0)

## 2018-07-20 NOTE — Addendum Note (Signed)
Addended by: Derinda Late on: 07/20/2018 11:00 AM   Modules accepted: Orders

## 2018-07-20 NOTE — Telephone Encounter (Signed)
Per shane, due to run around from [place], we can not get a clear answer with medicaid about coverage for medicaid, so referral to cardiology.Dana Mcneil

## 2018-07-20 NOTE — Progress Notes (Signed)
Subjective: Chief Complaint  Patient presents with  . feet swelling    feet swelling not feeling good  head hurting stomach pain   Here for recheck.  I last saw her in late January.  Here with father and new caregiver  Last visit we added MiraLAX to help with constipation, advise healthy diet, more exercise which she is working on and has seen improvement with exercise and diet.  She did start hydrochlorothiazide low-dose last visit but so far this has not seemed to help at all.  She still is getting regular swelling in the legs.  She denies chest pain or shortness of breath.  Since last week, eating more healthier, less junk.  Constipation - last visit we began Miralax which she is using, but last week did not have many BMs last week  Her psychiatrist did add 1 tablet on 1 of her medicines since last visit but did not recall which medication it was  Her new complaint today is headaches several days this past week including today.  She reports the headache is frontal, achy.  She denies cough, runny nose, sneezing, nausea, vomiting, diarrhea.  She does report some abdominal discomfort, she endorses some discomfort with urination.  No back pain no urinary frequency, no urgency, no blood in the urine.  Past Medical History:  Diagnosis Date  . [name]   . MR (mental retardation)    No past surgical history on file.  Current Outpatient Medications on File Prior to Visit  Medication Sig Dispense Refill  . asenapine (SAPHRIS) 5 MG SUBL 24 hr tablet Place 5 mg under the tongue 2 (two) times daily.    . benztropine (COGENTIN) 0.5 MG tablet Take 0.5 mg by mouth 2 (two) times daily.    . cetirizine (ZYRTEC) 10 MG tablet Take 10 mg by mouth daily.    . Cholecalciferol (VITAMIN D3) 2000 units capsule Take 2,000 Units by mouth daily.    . clonazePAM (KLONOPIN) 1 MG tablet Take 1 mg by mouth 3 (three) times daily.    . divalproex (DEPAKOTE) 250 MG DR tablet Take 750 mg by mouth at  bedtime.     Marland Kitchen doxepin (SINEQUAN) 75 MG capsule Take 75 mg by mouth.    . fluticasone (FLONASE) 50 MCG/ACT nasal spray Place 1 spray into both nostrils daily. 16 g 0  . hydrochlorothiazide (MICROZIDE) 12.5 MG capsule Take 1 capsule (12.5 mg total) by mouth daily. 30 capsule 1  . omeprazole (PRILOSEC) 40 MG capsule Take 1 capsule (40 mg total) by mouth daily. 90 capsule 0  . polyethylene glycol powder (GLYCOLAX/MIRALAX) powder 1 cap full daily in a beverage in the morning 3350 g 2  . PRESCRIPTION MEDICATION MI Paste Plus. -- Apply pea size amount nightly with finger or Q tip after flossing and brushing until gone    . sertraline (ZOLOFT) 50 MG tablet Take 100 mg by mouth daily.     Marland Kitchen guaiFENesin (MUCINEX) 600 MG 12 hr tablet Take 1 tablet (600 mg total) by mouth 2 (two) times daily. (Patient not taking: Reported on 07/20/2018) 10 tablet 0  . Phenylephrine-APAP-guaiFENesin 5-325-200 MG TABS Take 2 tablets by mouth every 6 (six) hours as needed. (Patient not taking: Reported on 04/28/2018) 30 each 0   No current facility-administered medications on file prior to visit.    ROS as in subjective   Objective: BP 120/76   Pulse (!) 118   Temp 98.5 F (36.9 C) (Oral)   Resp  16   Wt 151 lb 9.6 oz (68.8 kg)   SpO2 97%   BMI 30.62 kg/m     General appearance: alert, no distress, WD/WN, seems to not feel the best, but is cooperative, answers questions HEENT: normocephalic, sclerae anicteric, PERRLA, EOMi, nares patent, no discharge or erythema, pharynx normal Oral cavity: somewhat small airway, MMM, no lesions Neck: supple, no lymphadenopathy, no thyromegaly, no masses Heart: RRR, normal S1, S2, faint 1/6 murmur left upper sternal border Lungs: CTA bilaterally, no wheezes, rhonchi, or rales Abdomen: +bs, soft, non tender, non distended, no masses, no hepatomegaly, no splenomegaly Back: non tender extremities: 1+ pitting bilat LE edema, no cyanosis, no clubbing Pulses: 2+ symmetric, upper and  lower extremities, normal cap refill Neurological: alert, oriented, CN2-12 intact, strength normal upper extremities and lower extremities, sensation normal throughout, DTRs 2+ throughout, no cerebellar signs, gait normal Psychiatric: normal affect, behavior normal, pleasant   EKG Indication tachycardia, rate 104 bpm, PR 132 ms QRS 66 ms, QTC 439 ms, Axis XX degrees, sinus tachycardia, possible left atrial enlargement, Q in 3 more noticeable than 2015 EKG, T wave inversion in 3 unchanged since 2015 EKG    Assessment: Encounter Diagnoses  Name Primary?  . Edema, unspecified type Yes  . Nonintractable headache, unspecified chronicity pattern, unspecified headache type   . Constipation, unspecified constipation type   . High risk medication use   . Intellectual disability   . [name]   . Dysuria   . Tachycardia   . Snoring   . Obesity, unspecified classification, unspecified obesity type, unspecified whether serious comorbidity present   . Anemia, unspecified type   . Murmur      Plan: Edema-no improvement on low-dose HCTZ.  She is exercising more since last visit, trying to eat healthier.  Labs today.  Will likely increase dose of HCTZ  Headache-etiology unclear.  She has had some recent change in 1 of her psychiatric medications but we are not sure which, will we discussed the possibility of sleep apnea, she is also had recent change in diet for the better  Constipation- begin trial of Trulance 3 mg daily, counseled on good water and fiber intake  Dysuria -urinalysis reviewed, encourage good water intake  Tachycardia-EKG reviewed, labs today, will order echo  Obesity, snoring, headache, tachycardia-consider sleep study  Anemia improved on labs in January compared to October.  Iron sats are still low but overall her anemia had improved.  Of note, on Depo-Provera for contraception    Recommendations:  Constipation  Start MiraLAX  Begin trial of  Trulance 3 mg, 1 tablet daily  Swelling  Continue hydrochlorothiazide 12.5 mg daily for now but we are checking some additional studies  Snoring, tachycardia, headache  We are going to refer for sleep study to look for sleep apnea  Headache  She can use some Tylenol for now but we are going to do some further tests including sleep study  Tachycardia/fast heart rate  We are going to get an echocardiogram to look at the structure of the heart  Burning with urination or discomfort with urination  Drink plenty of clear fluids and water daily  Her urinalysis was normal today  Dana Mcneil was seen today for feet swelling.  Diagnoses and all orders for this visit:  Edema, unspecified type -     Basic metabolic panel -     Brain natriuretic peptide -     EKG 12-Lead -     ECHOCARDIOGRAM COMPLETE BUBBLE STUDY;  Future  Nonintractable headache, unspecified chronicity pattern, unspecified headache type  Constipation, unspecified constipation type  High risk medication use  Intellectual disability  [name]  Dysuria  Tachycardia -     Basic metabolic panel -     Brain natriuretic peptide -     EKG 12-Lead -     ECHOCARDIOGRAM COMPLETE BUBBLE STUDY; Future  Snoring  Obesity, unspecified classification, unspecified obesity type, unspecified whether serious comorbidity present  Anemia, unspecified type -     Ferritin -     ECHOCARDIOGRAM COMPLETE BUBBLE STUDY; Future  Murmur -     ECHOCARDIOGRAM COMPLETE BUBBLE STUDY; Future

## 2018-07-20 NOTE — Patient Instructions (Signed)
Recommendations:  Constipation  Start MiraLAX  Begin trial of Trulance 3 mg, 1 tablet daily  Swelling  Continue hydrochlorothiazide 12.5 mg daily for now but we are checking some additional studies  Snoring, tachycardia, headache  We are going to refer for sleep study to look for sleep apnea  Headache  She can use some Tylenol for now but we are going to do some further tests including sleep study  Tachycardia/fast heart rate  We are going to get an echocardiogram to look at the structure of the heart  Burning with urination or discomfort with urination  Drink plenty of clear fluids and water daily  Her urinalysis was normal today

## 2018-07-21 LAB — FERRITIN: Ferritin: 40 ng/mL (ref 15–150)

## 2018-07-21 LAB — BASIC METABOLIC PANEL
BUN/Creatinine Ratio: 21 (ref 9–23)
BUN: 20 mg/dL (ref 6–20)
CALCIUM: 9 mg/dL (ref 8.7–10.2)
CO2: 22 mmol/L (ref 20–29)
Chloride: 105 mmol/L (ref 96–106)
Creatinine, Ser: 0.97 mg/dL (ref 0.57–1.00)
GFR calc Af Amer: 91 mL/min/{1.73_m2} (ref >59)
GFR calc non Af Amer: 79 mL/min/{1.73_m2} (ref >59)
Glucose: 92 mg/dL (ref 65–99)
Potassium: 4.4 mmol/L (ref 3.5–5.2)
Sodium: 142 mmol/L (ref 134–144)

## 2018-07-21 LAB — BRAIN NATRIURETIC PEPTIDE: BNP: 57.3 pg/mL (ref 0.0–100.0)

## 2018-07-22 ENCOUNTER — Other Ambulatory Visit: Payer: Self-pay | Admitting: Medical

## 2018-07-22 DIAGNOSIS — J189 Pneumonia, unspecified organism: Secondary | ICD-10-CM | POA: Insufficient documentation

## 2018-07-22 MED ORDER — HYDROCHLOROTHIAZIDE 25 MG PO TABS: 25.0000 mg | ORAL_TABLET | Freq: Every day | ORAL | 2 refills | Status: DC

## 2018-07-26 ENCOUNTER — Ambulatory Visit: Payer: Self-pay | Admitting: Cardiology
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
## 2018-07-29 ENCOUNTER — Ambulatory Visit (HOSPITAL_COMMUNITY): Payer: Medicaid Other | Attending: Cardiology

## 2018-07-29 ENCOUNTER — Encounter: Payer: Self-pay | Admitting: Cardiology

## 2018-07-29 ENCOUNTER — Other Ambulatory Visit: Payer: Self-pay

## 2018-07-29 ENCOUNTER — Ambulatory Visit (INDEPENDENT_AMBULATORY_CARE_PROVIDER_SITE_OTHER): Payer: Medicaid Other | Admitting: Cardiology

## 2018-07-29 VITALS — BP 120/74 | HR 95 | Ht 59.0 in | Wt 152.0 lb

## 2018-07-29 DIAGNOSIS — R609 Edema, unspecified: Secondary | ICD-10-CM | POA: Diagnosis not present

## 2018-07-29 DIAGNOSIS — R011 Cardiac murmur, unspecified: Secondary | ICD-10-CM | POA: Diagnosis not present

## 2018-07-29 DIAGNOSIS — R000 Tachycardia, unspecified: Secondary | ICD-10-CM | POA: Diagnosis not present

## 2018-07-29 LAB — ECHOCARDIOGRAM COMPLETE
HEIGHTINCHES: 59 in
Weight: 2432 oz

## 2018-07-29 MED ORDER — PERFLUTREN LIPID MICROSPHERE
1.0000 mL | INTRAVENOUS | Status: AC | PRN
  Administered 2018-07-29: 2 mL via INTRAVENOUS

## 2018-08-09 ENCOUNTER — Telehealth: Payer: Self-pay | Admitting: Cardiology

## 2018-08-09 NOTE — Telephone Encounter (Signed)
Pt aware of her Echo 

## 2018-08-09 NOTE — Telephone Encounter (Signed)
Calling back for results.

## 2018-09-03 ENCOUNTER — Other Ambulatory Visit: Payer: Self-pay

## 2018-09-03 DIAGNOSIS — F411 Generalized anxiety disorder: Secondary | ICD-10-CM

## 2018-09-03 DIAGNOSIS — Q8719 Other congenital malformation syndromes predominantly associated with short stature: Secondary | ICD-10-CM

## 2018-09-03 DIAGNOSIS — F79 Unspecified intellectual disabilities: Secondary | ICD-10-CM

## 2018-09-03 DIAGNOSIS — F419 Anxiety disorder, unspecified: Secondary | ICD-10-CM

## 2018-09-03 DIAGNOSIS — F4310 Post-traumatic stress disorder, unspecified: Secondary | ICD-10-CM

## 2018-09-16 ENCOUNTER — Other Ambulatory Visit: Payer: Self-pay

## 2018-09-16 ENCOUNTER — Encounter: Payer: Self-pay | Admitting: Medical

## 2018-09-16 ENCOUNTER — Ambulatory Visit (INDEPENDENT_AMBULATORY_CARE_PROVIDER_SITE_OTHER): Payer: Medicaid Other | Admitting: Medical

## 2018-09-16 ENCOUNTER — Telehealth: Payer: Self-pay | Admitting: Internal Medicine

## 2018-09-16 VITALS — BP 110/70 | HR 108 | Temp 98.0°F

## 2018-09-16 DIAGNOSIS — F984 Stereotyped movement disorders: Secondary | ICD-10-CM | POA: Diagnosis not present

## 2018-09-16 DIAGNOSIS — R4689 Other symptoms and signs involving appearance and behavior: Secondary | ICD-10-CM | POA: Diagnosis not present

## 2018-09-16 DIAGNOSIS — S0101XA Laceration without foreign body of scalp, initial encounter: Secondary | ICD-10-CM | POA: Diagnosis not present

## 2018-09-16 DIAGNOSIS — Z23 Encounter for immunization: Secondary | ICD-10-CM

## 2018-09-16 DIAGNOSIS — Q8719 Other congenital malformation syndromes predominantly associated with short stature: Secondary | ICD-10-CM

## 2018-09-16 DIAGNOSIS — F79 Unspecified intellectual disabilities: Secondary | ICD-10-CM

## 2018-09-16 DIAGNOSIS — Q899 Congenital malformation, unspecified: Secondary | ICD-10-CM

## 2018-09-16 DIAGNOSIS — F4310 Post-traumatic stress disorder, unspecified: Secondary | ICD-10-CM

## 2018-09-16 MED ORDER — POTASSIUM CHLORIDE ER 10 MEQ PO TBCR: 10.0000 meq | EXTENDED_RELEASE_TABLET | Freq: Every day | ORAL | 0 refills | Status: DC

## 2018-09-16 MED ORDER — FUROSEMIDE 20 MG PO TABS: 20.0000 mg | ORAL_TABLET | Freq: Every day | ORAL | 0 refills | Status: DC

## 2018-09-16 NOTE — Telephone Encounter (Signed)
Pt is needing a helmet so she does not bang her head as she busted her head open yesterday.   We put an order in to neuro rehab but they have stopped appts for now so I have called [facility]) and they said that they must have an rx and virtual(face to face notes) and demographics sent over to be able to call the patient. We are doing a zoom visit today to get this order processed.   [address], [place], [place] [address] Phone: [phone] Fax : [phone]
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
## 2018-09-17 ENCOUNTER — Telehealth: Payer: Self-pay | Admitting: Medical

## 2018-09-17 ENCOUNTER — Other Ambulatory Visit (INDEPENDENT_AMBULATORY_CARE_PROVIDER_SITE_OTHER): Payer: Medicaid Other

## 2018-09-17 ENCOUNTER — Other Ambulatory Visit: Payer: Self-pay

## 2018-09-17 DIAGNOSIS — S0101XA Laceration without foreign body of scalp, initial encounter: Secondary | ICD-10-CM

## 2018-09-17 DIAGNOSIS — Z23 Encounter for immunization: Secondary | ICD-10-CM

## 2018-09-17 NOTE — Telephone Encounter (Signed)
See other message

## 2018-09-17 NOTE — Telephone Encounter (Signed)
Send in script for soft helmet, sent office notes.   Please send copy of my office note to her counselor as well.

## 2018-09-20 NOTE — Telephone Encounter (Signed)
This was done already.

## 2018-09-24 ENCOUNTER — Encounter: Payer: Self-pay | Admitting: Medical

## 2018-10-01 ENCOUNTER — Other Ambulatory Visit: Payer: Self-pay

## 2018-10-01 ENCOUNTER — Emergency Department (HOSPITAL_BASED_OUTPATIENT_CLINIC_OR_DEPARTMENT_OTHER)
Admission: EM | Admit: 2018-10-01 | Discharge: 2018-10-01 | Disposition: A | Discharge status: Home / Self Care | Payer: Medicaid Other | Attending: Emergency Medicine | Admitting: Emergency Medicine

## 2018-10-01 ENCOUNTER — Encounter (HOSPITAL_BASED_OUTPATIENT_CLINIC_OR_DEPARTMENT_OTHER): Payer: Self-pay | Admitting: Emergency Medicine

## 2018-10-01 DIAGNOSIS — S098XXA Other specified injuries of head, initial encounter: Secondary | ICD-10-CM | POA: Diagnosis present

## 2018-10-01 DIAGNOSIS — S0003XA Contusion of scalp, initial encounter: Secondary | ICD-10-CM

## 2018-10-01 DIAGNOSIS — Y929 Unspecified place or not applicable: Secondary | ICD-10-CM | POA: Diagnosis not present

## 2018-10-01 DIAGNOSIS — W2209XA Striking against other stationary object, initial encounter: Secondary | ICD-10-CM | POA: Diagnosis not present

## 2018-10-01 DIAGNOSIS — Z79899 Other long term (current) drug therapy: Secondary | ICD-10-CM | POA: Diagnosis not present

## 2018-10-01 DIAGNOSIS — Y939 Activity, unspecified: Secondary | ICD-10-CM | POA: Insufficient documentation

## 2018-10-01 DIAGNOSIS — S0101XA Laceration without foreign body of scalp, initial encounter: Secondary | ICD-10-CM

## 2018-10-01 DIAGNOSIS — Y999 Unspecified external cause status: Secondary | ICD-10-CM | POA: Diagnosis not present
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
## 2018-10-01 NOTE — ED Provider Notes (Signed)
[facility] EMERGENCY DEPARTMENT Provider Note   CSN: [id number] Arrival date & time: 10/01/18  1350    History   Chief Complaint Chief Complaint  Patient presents with  . Head Injury    HPI Dana Mcneil is a 28 y.o. female.     Patient c/o hitting head against wall. Pt with hx MR and self injurious behavior at times. Laceration to anterior scalp. Minimal bleeding stopped w pressure. Last tetanus this year. No loc with event, and since incident pts mental status has remained c/w baseline, pt acting normally. Pt has helmet, but she wasn't wearing at time of hitting head. No vomiting. No other pain or injury. Pt limited historian, MR - level 5 caveat. No recent seizure. No anticoag use.   The history is provided by the patient and a caregiver. The history is limited by the condition of the patient.  Head Injury    Past Medical History:  Diagnosis Date  . [name]   . MR (mental retardation)     Patient Active Problem List   Diagnosis Date Noted  . Pneumonia 07/22/2018  . Dysuria 07/20/2018  . Tachycardia 07/20/2018  . Snoring 07/20/2018  . Obesity 07/20/2018  . Nonintractable headache 07/20/2018  . Murmur 07/20/2018  . Anemia 06/18/2018  . Edema 06/18/2018  . Constipation 06/18/2018  . Bloating 06/18/2018  . High risk medication use 03/11/2018  . 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 Need for influenza vaccination 03/11/2018  . Allergic rhinitis due to pollen 03/11/2018  . Anxiety 03/11/2018  . Screening for lipid disorders 03/11/2018  . 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 Difficulty with speech 10/22/2015  . Wisdom teeth extracted 07/17/2015  . Dysphagia, pharyngeal phase 04/20/2015  . Vitamin D deficiency 04/20/2015  . Seasonal allergies 10/19/2014  . Weight loss 10/19/2014  . Intellectual disability 04/16/2013  . Irritability and anger 04/16/2013  . Aggression 04/16/2013  . PTSD (post-traumatic stress disorder)  04/16/2013  . GENERALIZED ANXIETY DISORDER 10/20/2007  . ESOPHAGEAL REFLUX 10/20/2007  . Dysphagia 10/20/2007    History reviewed. No pertinent surgical history.   OB History   No obstetric history on file.      Home Medications    Prior to Admission medications   Medication Sig Start Date End Date Taking? Authorizing Provider  asenapine (SAPHRIS) 5 MG SUBL 24 hr tablet Place 5 mg under the tongue 2 (two) times daily.    [provider]  benztropine (COGENTIN) 0.5 MG tablet Take 0.5 mg by mouth 2 (two) times daily.    [provider]  cetirizine (ZYRTEC) 10 MG tablet Take 10 mg by mouth daily.    [provider]  Cholecalciferol (VITAMIN D3) 2000 units capsule Take 2,000 Units by mouth daily.    [provider]  clonazePAM (KLONOPIN) 1 MG tablet Take 1 mg by mouth 3 (three) times daily.    [provider]  divalproex (DEPAKOTE) 250 MG DR tablet Take 750 mg by mouth at bedtime.     [provider]  doxepin (SINEQUAN) 75 MG capsule Take 75 mg by mouth 2 (two) times a day.     [provider]  fluticasone (FLONASE) 50 MCG/ACT nasal spray Place 1 spray into both nostrils daily. 02/20/18   Petrucelli, Samantha R, PA-C  furosemide (LASIX) 20 MG tablet Take 1 tablet (20 mg total) by mouth daily. 09/16/18   Tysinger, Kermit Balo, PA-C  gabapentin (NEURONTIN) 300 MG capsule Take 300 mg by mouth 2 (  two) times a day.    [provider]  guaiFENesin (MUCINEX) 600 MG 12 hr tablet Take 1 tablet (600 mg total) by mouth 2 (two) times daily. Patient not taking: Reported on 07/20/2018 04/28/18   Tysinger, Kermit Balo, PA-C  hydrochlorothiazide (HYDRODIURIL) 25 MG tablet Take 1 tablet (25 mg total) by mouth daily. 07/22/18   Tysinger, Kermit Balo, PA-C  omeprazole (PRILOSEC) 40 MG capsule Take 1 capsule (40 mg total) by mouth daily. 06/21/18 06/21/19  Tysinger, Kermit Balo, PA-C  polyethylene glycol powder (GLYCOLAX/MIRALAX) powder 1 cap full daily in a  beverage in the morning Patient not taking: Reported on 09/16/2018 06/18/18   Tysinger, Kermit Balo, PA-C  potassium chloride (KLOR-CON 10) 10 MEQ tablet Take 1 tablet (10 mEq total) by mouth daily. 09/16/18   Tysinger, Kermit Balo, PA-C  PRESCRIPTION MEDICATION MI Paste Plus. -- Apply pea size amount nightly with finger or Q tip after flossing and brushing until gone    [provider]  sertraline (ZOLOFT) 50 MG tablet Take 100 mg by mouth daily.     [provider]    Family History Family History  Problem Relation Age of Onset  . Diabetes Father   . Cancer Maternal Grandfather   . Hyperlipidemia Other   . Hypertension Other   . Colon cancer Neg Hx   . Colon polyps Neg Hx   . Kidney disease Neg Hx   . Esophageal cancer Neg Hx     Social History Social History   Tobacco Use  . Smoking status: Never Smoker  . Smokeless tobacco: Never Used  Substance Use Topics  . Alcohol use: No    Alcohol/week: 0.0 standard drinks  . Drug use: No     Allergies   Haldol [haloperidol lactate] and Lorazepam   Review of Systems Review of Systems  Unable to perform ROS: Patient nonverbal  hx MR - level 5 caveat. Mental status is c/w baseline. No nv.    Physical Exam Updated Vital Signs BP 137/79 (BP Location: Right Arm)   Pulse (!) 112   Resp 18   SpO2 100%   Physical Exam Vitals signs and nursing note reviewed.  Constitutional:      Appearance: Normal appearance. She is well-developed.  HENT:     Head: Atraumatic.     Comments: 2 cm laceration to anterior scalp.     Nose: Nose normal.     Mouth/Throat:     Mouth: Mucous membranes are moist.  Eyes:     General: No scleral icterus.    Conjunctiva/sclera: Conjunctivae normal.     Pupils: Pupils are equal, round, and reactive to light.  Neck:     Musculoskeletal: Normal range of motion and neck supple. No neck rigidity or muscular tenderness.     Trachea: No tracheal deviation.  Cardiovascular:     Rate and Rhythm:  Regular rhythm.     Pulses: Normal pulses.  Pulmonary:     Effort: Pulmonary effort is normal. No respiratory distress.     Breath sounds: Normal breath sounds.  Abdominal:     General: There is no distension.     Palpations: Abdomen is soft.     Tenderness: There is no abdominal tenderness.  Genitourinary:    Comments: No cva tenderness.  Musculoskeletal:        General: No swelling or tenderness.     Comments: CTLS spine, non tender, aligned, no step off.   Skin:    General: Skin is warm  and dry.     Findings: No rash.  Neurological:     Mental Status: She is alert.     Comments: Alert, content, interactive w family/caregiver. Mental status reported c/w baseline. Moves bil extremities purposefully with good strength.   Psychiatric:        Mood and Affect: Mood normal.      ED Treatments / Results  Labs (all labs ordered are listed, but only abnormal results are displayed) Labs Reviewed - No data to display  EKG None  Radiology No results found.  Procedures .Marland Kitchen.Laceration Repair Date/Time: 10/01/2018 2:18 PM Performed by: Cathren LaineSteinl, Clarinda Obi, MD Authorized by: Cathren LaineSteinl, Natashia Roseman, MD   Anesthesia (see MAR for exact dosages):    Anesthesia method:  Local infiltration   Local anesthetic:  Lidocaine 2% WITH epi Laceration details:    Location:  Scalp   Scalp location:  Frontal   Length (cm):  2 Repair type:    Repair type:  Simple Pre-procedure details:    Preparation:  Patient was prepped and draped in usual sterile fashion Exploration:    Wound extent: no foreign bodies/material noted   Treatment:    Area cleansed with:  Betadine   Amount of cleaning:  Standard   Irrigation solution:  Sterile saline Skin repair:    Repair method:  Staples   Number of staples:  2 Approximation:    Approximation:  Close Post-procedure details:    Dressing:  Antibiotic ointment   Patient tolerance of procedure:  Tolerated well, no immediate complications   (including critical care  time)  Medications Ordered in ED Medications - No data to display   Initial Impression / Assessment and Plan / ED Course  I have reviewed the triage vital signs and the nursing notes.  Pertinent labs & imaging results that were available during my care of the patient were reviewed by me and considered in my medical decision making (see chart for details).  No loc. Mental status c/w baseline since incident. No nv. No red flags.   Laceration to scalp. No fb seen. No significant sts to area.   Tetanus up to date - verified by caregivers.   Wound repaired. Bacitracin applied.   Reviewed nursing notes and prior charts for additional history.   Patients mental status remains c/w baseline. Remains interactive, content, and in no apparent pain or discomfort.   Pt appears stable for d/c.   Staple removal in 7-10 days.   Return precautions provided.     Final Clinical Impressions(s) / ED Diagnoses   Final diagnoses:  None    ED Discharge Orders    None       Cathren LaineSteinl, Dhanush Jokerst, MD 10/01/18 1420

## 2018-10-01 NOTE — Discharge Instructions (Signed)
It was our pleasure to provide your ER care today - we hope that you feel better.  Keep area very clean.   Have staples removed, your doctor or urgent care, in 8-10 days.   Return to ER if worse, new symptoms, infection of wound, new or severe pain, vomiting, change in mental status, other concern.

## 2018-10-06 ENCOUNTER — Telehealth: Payer: Self-pay | Admitting: Medical

## 2018-10-06 NOTE — Telephone Encounter (Signed)
I will need to get a urine pregnancy in the near future just to have one on record to confirm negative.   Pick a time to bring in for POCT urine pregnancy.  Has she ever had a pap smear?  I'll write paper script.

## 2018-10-06 NOTE — Telephone Encounter (Signed)
Pharmacy sent refill request for medroxyprogesterine  For DEPO-PROVERA 150 qty 1 ml in ject 37m once every 3 months please send to [facility] - [place], [place] - [address]

## 2018-10-06 NOTE — Telephone Encounter (Signed)
Dana Mcneil is scheduled for appt on 10-12-18 for visit for staple removal and urine HCG.  She has had pap smear before but it has been a long time and she went to GYN for that.

## 2018-10-12 ENCOUNTER — Encounter: Payer: Self-pay | Admitting: Medical

## 2018-10-12 ENCOUNTER — Other Ambulatory Visit: Payer: Self-pay

## 2018-10-12 ENCOUNTER — Ambulatory Visit (INDEPENDENT_AMBULATORY_CARE_PROVIDER_SITE_OTHER): Payer: Medicaid Other | Admitting: Medical

## 2018-10-12 VITALS — BP 140/100 | HR 120 | Temp 97.6°F | Resp 18 | Ht 59.0 in | Wt 167.0 lb

## 2018-10-12 DIAGNOSIS — Z4802 Encounter for removal of sutures: Secondary | ICD-10-CM

## 2018-10-12 DIAGNOSIS — R030 Elevated blood-pressure reading, without diagnosis of hypertension: Secondary | ICD-10-CM

## 2018-10-12 DIAGNOSIS — Z3042 Encounter for surveillance of injectable contraceptive: Secondary | ICD-10-CM | POA: Diagnosis not present

## 2018-10-12 DIAGNOSIS — R635 Abnormal weight gain: Secondary | ICD-10-CM | POA: Diagnosis not present

## 2018-10-12 DIAGNOSIS — R609 Edema, unspecified: Secondary | ICD-10-CM | POA: Diagnosis not present

## 2018-10-12 DIAGNOSIS — Z79899 Other long term (current) drug therapy: Secondary | ICD-10-CM

## 2018-10-12 DIAGNOSIS — F411 Generalized anxiety disorder: Secondary | ICD-10-CM

## 2018-10-12 DIAGNOSIS — R454 Irritability and anger: Secondary | ICD-10-CM

## 2018-10-12 DIAGNOSIS — Q8719 Other congenital malformation syndromes predominantly associated with short stature: Secondary | ICD-10-CM

## 2018-10-12 DIAGNOSIS — Z309 Encounter for contraceptive management, unspecified: Secondary | ICD-10-CM

## 2018-10-12 DIAGNOSIS — Q899 Congenital malformation, unspecified: Secondary | ICD-10-CM

## 2018-10-12 DIAGNOSIS — E669 Obesity, unspecified: Secondary | ICD-10-CM

## 2018-10-12 DIAGNOSIS — R479 Unspecified speech disturbances: Secondary | ICD-10-CM

## 2018-10-12 LAB — POCT URINE PREGNANCY: Preg Test, Ur: NEGATIVE

## 2018-10-12 NOTE — Patient Instructions (Signed)
Recommendations  Monitor blood pressure and weights at home daily the next 10 days  Lets stop the Hydrochlorothiazide and potassium pills for 1 week to see if any change in swelling or weight  I would like her to walk for exercise 30 minutes twice daily  Continue to elevated the legs every now and then for swelling  Diet recommendations:   Breakfast You may eat 1 of the following  Omelette, which can include a small amount of cheese, and vegetables such as peppers, mushrooms, small pieces of [place] or chicken  Low sugar yogurt serving which can include some fruit such as berries  Egg whites or hard boiled egg and meat (1-2 strips of bacon, or small piece of [place] sausage or [place] bacon)   Mid-morning snack 1 fruit serving such as one of the following:  medium-sized apple  medium-sized orange,  Tangerine  1/2 banana   3/4 cup of fresh berries or frozen berries  A protein source such as one of the following:  8 almonds   small handful of walnuts or other nuts  small piece of cheese,  low sugar yogurt   Lunch A protein source such as 1 of the following: . 1 serving of beans such as black beans, pinto beans, green beans, or edamame (soy beans) . 1 meat serving such as 6 oz or deck of card size serving of fish, skinless chicken, or [place], either grilled or baked preferably.   You can use some pork or beef, but limit this compared to fish, chicken or [place] Vegetable - Half of your plate should be a non-starchy vegetables!  So avoid white potatoes and corn.  Otherwise, eat a large portion of vegetables. . Avocado, cucumber, tomato, carrots, greens, lettuce, squash, okra, etc.  . Vegetables can include salad with olive oil/vinaigrette dressing   Mid-afternoon snack 1 fruit serving such as one of the following:  medium-sized apple  medium-sized orange,  Tangerine  1/2 banana   3/4 cup of fresh berries or frozen berries  A protein source such as one of  the following:  8 almonds   small handful of walnuts or other nuts  small piece of cheese,  low sugar yogurt   Dinner A protein source such as 1 of the following: . 1 serving of beans such as black beans, pinto beans, green beans, or edamame (soy beans) . 1 meat serving such as 6 oz or deck of card size serving of fish, skinless chicken, or [place], either grilled or baked preferably.   You can use some pork or beef, but limit this compared to fish, chicken or [place] Vegetable - Half of your plate should be a non-starchy vegetables!  So avoid white potatoes and corn.  Otherwise, eat a large portion of vegetables. . Avocado, cucumber, tomato, carrots, greens, lettuce, squash, okra, etc.  . Vegetables can include salad with olive oil/vinaigrette dressing   Beverages: Water Unsweet tea Home made juice with a juicer without sugar added other than small bit of honey or agave nectar Water with sugar free flavor such as [place]   AVOID.... For the time being I want you to cut out the following items completely: . Soda, sweet tea, juice, beer or wine or alcohol . ALL grains and breads including rice, pasta, bread, cereal . Sweets such as cake, candy, pies, chips, cookies, chocolate

## 2018-10-12 NOTE — Progress Notes (Signed)
Subjective:  Dana Mcneil is a 28 y.o. female who presents for Chief Complaint  Patient presents with  . staple removal    staple removal and HCG       Here for several concerns.  Here with caregiver Bridgette/home health aid and father today.  She has congenital abnormalities all history of anger outburst and head beating against the wall.  From her recent visits she has been wearing the head protective device that we ordered.  However from time to time she takes it off for when it is taken off she will still have moments where she starts beating her head as well.  She recently had a gash opened up on her forehead after hitting a wall which required 2 staples to be placed in the emergency department.  She is here to have those removed today.  His main concern today is additional weight gain.  They have tried to work with her on healthier diet, some walking for exercise but she continues to gain weight.  She is not eating much junk food.  She does sit for hours at the table reading.  Have tried to cut back on salt  She has not had any change in her swelling, no worse no better.  Is taking hydrochlorothiazide and potassium but no significant improvement.  No reported side effects.  She is on birth control and today we had plan to do a urine pregnancy test just to confirm although she has not sexually active.  The family is still working to get her in with new psychiatrist for eval and review of medications  No other aggravating or relieving factors.    No other C/o.  The following portions of the patient's history were reviewed and updated as appropriate: allergies, current medications, past family history, past medical history, past social history, past surgical history and problem list.   Past Medical History:  Diagnosis Date  . [name]   . MR (mental retardation)    Current Outpatient Medications on File Prior to Visit  Medication Sig Dispense Refill  .  asenapine (SAPHRIS) 5 MG SUBL 24 hr tablet Place 5 mg under the tongue 2 (two) times daily.    . benztropine (COGENTIN) 0.5 MG tablet Take 0.5 mg by mouth 2 (two) times daily.    . cetirizine (ZYRTEC) 10 MG tablet Take 10 mg by mouth daily.    . Cholecalciferol (VITAMIN D3) 2000 units capsule Take 2,000 Units by mouth daily.    . clonazePAM (KLONOPIN) 1 MG tablet Take 1 mg by mouth 3 (three) times daily.    . divalproex (DEPAKOTE) 250 MG DR tablet Take 750 mg by mouth at bedtime.     Marland Kitchen. doxepin (SINEQUAN) 75 MG capsule Take 75 mg by mouth 2 (two) times a day.     . fluticasone (FLONASE) 50 MCG/ACT nasal spray Place 1 spray into both nostrils daily. 16 g 0  . furosemide (LASIX) 20 MG tablet Take 1 tablet (20 mg total) by mouth daily. 30 tablet 0  . gabapentin (NEURONTIN) 300 MG capsule Take 300 mg by mouth 2 (two) times a day.    . hydrochlorothiazide (HYDRODIURIL) 25 MG tablet Take 1 tablet (25 mg total) by mouth daily. 30 tablet 2  . omeprazole (PRILOSEC) 40 MG capsule Take 1 capsule (40 mg total) by mouth daily. 90 capsule 0  . potassium chloride (KLOR-CON 10) 10 MEQ tablet Take 1 tablet (10 mEq total) by mouth daily. 30 tablet 0  .  PRESCRIPTION MEDICATION MI Paste Plus. -- Apply pea size amount nightly with finger or Q tip after flossing and brushing until gone    . sertraline (ZOLOFT) 50 MG tablet Take 100 mg by mouth daily.     Marland Kitchen guaiFENesin (MUCINEX) 600 MG 12 hr tablet Take 1 tablet (600 mg total) by mouth 2 (two) times daily. (Patient not taking: Reported on 07/20/2018) 10 tablet 0  . polyethylene glycol powder (GLYCOLAX/MIRALAX) powder 1 cap full daily in a beverage in the morning (Patient not taking: Reported on 09/16/2018) 3350 g 2   No current facility-administered medications on file prior to visit.     ROS Otherwise as in subjective above  Objective: BP (!) 140/100   Pulse (!) 120   Temp 97.6 F (36.4 C) (Temporal)   Resp 18   Ht 4\' 11"  (1.499 m)   Wt 167 lb (75.8 kg)   SpO2  98%   BMI 33.73 kg/m   General appearance: alert, no distress, well developed, well nourished Skin: right forehead with linear wound, healing appropriately and 2 staples placed. Neck: supple, no lymphadenopathy, no thyromegaly, no masses Heart: RRR, normal S1, S2, no murmurs Lungs: CTA bilaterally, no wheezes, rhonchi, or rales Pulses: 2+ radial pulses, 2+ pedal pulses, normal cap refill Ext: no significant LE edema   Assessment: Encounter Diagnoses  Name Primary?  . Weight gain Yes  . Edema, unspecified type   . Removal of staple   . Encounter for contraceptive management, unspecified type   . [name]   . Difficulty with speech   . Congenital abnormalities   . High risk medication use   . Generalized anxiety disorder   . Irritability and anger   . Obesity, unspecified classification, unspecified obesity type, unspecified whether serious comorbidity present   . Elevated blood pressure reading without diagnosis of hypertension      Plan: Weight gain-likely due to psychotropic medications.  We counseled on diet, discussed increasing exercise, and counseling healthy diet choices and gave handout on this, avoid junk food.  Edema- I see no significant edema on exam.  Advised that he temporarily stop hydrochlorothiazide and potassium and let see how they reports the swelling goes of the next week or 2.  Staple removal-clean and prepped for removal of 2 staples.  Patient tolerated procedure just fine  Her blood pressure was elevated today but I think this was in part due to anxiety of the staples being removed.  Her blood pressures are usually normal  Contraceptive management-urine pregnancy negative today.  She is not sexually active.  Continue plan to use protective headgear, continue plan the established with another psychiatrist for medication management   Dana Mcneil was seen today for staple removal.  Diagnoses and all orders for this visit:  Weight  gain  Edema, unspecified type  Removal of staple  Encounter for contraceptive management, unspecified type -     POCT urine pregnancy  [name]  Difficulty with speech  Congenital abnormalities  High risk medication use  Generalized anxiety disorder  Irritability and anger  Obesity, unspecified classification, unspecified obesity type, unspecified whether serious comorbidity present  Elevated blood pressure reading without diagnosis of hypertension   Follow up: 2 weeks

## 2018-10-19 ENCOUNTER — Telehealth: Payer: Self-pay | Admitting: Medical

## 2018-10-19 NOTE — Telephone Encounter (Signed)
   Mother would like to talk to you about issues with Dana Mcneil  Feet/legs swelling  Worse very concerned Legs turning red, doing the ortho socks Legs swelling so much very hard to get socks on Trying to follow diet as much as possible Per mother swelling has her very concerned  Due to behavior/mental health issues very  Challenging to get her to exercise  Getting in at least one walk in daily due to behavior issues Even when she is able to walk, she still has  swelling  Mom is also concerned because her SOB is worse and to the point that she is concerned about her being so out of breath when they are trying to walk her She is also having some cough with congestion at night  She would like to discuss sending [name] to PT where she can also be monitored due to the sob

## 2018-10-21 ENCOUNTER — Emergency Department (HOSPITAL_BASED_OUTPATIENT_CLINIC_OR_DEPARTMENT_OTHER): Payer: Medicaid Other

## 2018-10-21 ENCOUNTER — Other Ambulatory Visit: Payer: Self-pay

## 2018-10-21 ENCOUNTER — Encounter (HOSPITAL_COMMUNITY): Payer: Self-pay | Admitting: Emergency Medicine

## 2018-10-21 ENCOUNTER — Observation Stay (HOSPITAL_COMMUNITY)
Admission: EM | Admit: 2018-10-21 | Discharge: 2018-10-22 | Disposition: A | Discharge status: Home Health | Payer: Medicaid Other | Attending: Internal Medicine | Admitting: Internal Medicine

## 2018-10-21 ENCOUNTER — Emergency Department (HOSPITAL_COMMUNITY): Payer: Medicaid Other

## 2018-10-21 ENCOUNTER — Telehealth: Payer: Self-pay

## 2018-10-21 DIAGNOSIS — K429 Umbilical hernia without obstruction or gangrene: Secondary | ICD-10-CM | POA: Diagnosis not present

## 2018-10-21 DIAGNOSIS — M7989 Other specified soft tissue disorders: Secondary | ICD-10-CM

## 2018-10-21 DIAGNOSIS — F79 Unspecified intellectual disabilities: Secondary | ICD-10-CM

## 2018-10-21 DIAGNOSIS — Z7951 Long term (current) use of inhaled steroids: Secondary | ICD-10-CM | POA: Insufficient documentation

## 2018-10-21 DIAGNOSIS — R600 Localized edema: Secondary | ICD-10-CM | POA: Diagnosis not present

## 2018-10-21 DIAGNOSIS — Q8719 Other congenital malformation syndromes predominantly associated with short stature: Secondary | ICD-10-CM | POA: Insufficient documentation

## 2018-10-21 DIAGNOSIS — E669 Obesity, unspecified: Secondary | ICD-10-CM | POA: Diagnosis not present

## 2018-10-21 DIAGNOSIS — J189 Pneumonia, unspecified organism: Principal | ICD-10-CM | POA: Diagnosis present

## 2018-10-21 DIAGNOSIS — R140 Abdominal distension (gaseous): Secondary | ICD-10-CM

## 2018-10-21 DIAGNOSIS — R030 Elevated blood-pressure reading, without diagnosis of hypertension: Secondary | ICD-10-CM | POA: Insufficient documentation

## 2018-10-21 DIAGNOSIS — F4310 Post-traumatic stress disorder, unspecified: Secondary | ICD-10-CM | POA: Diagnosis not present

## 2018-10-21 DIAGNOSIS — R000 Tachycardia, unspecified: Secondary | ICD-10-CM | POA: Diagnosis not present

## 2018-10-21 DIAGNOSIS — Z8249 Family history of ischemic heart disease and other diseases of the circulatory system: Secondary | ICD-10-CM | POA: Diagnosis not present

## 2018-10-21 DIAGNOSIS — K219 Gastro-esophageal reflux disease without esophagitis: Secondary | ICD-10-CM | POA: Diagnosis not present

## 2018-10-21 DIAGNOSIS — Z6833 Body mass index (BMI) 33.0-33.9, adult: Secondary | ICD-10-CM | POA: Insufficient documentation

## 2018-10-21 DIAGNOSIS — R0602 Shortness of breath: Secondary | ICD-10-CM | POA: Diagnosis present

## 2018-10-21 DIAGNOSIS — J9811 Atelectasis: Secondary | ICD-10-CM | POA: Diagnosis not present

## 2018-10-21 DIAGNOSIS — F411 Generalized anxiety disorder: Secondary | ICD-10-CM | POA: Diagnosis not present

## 2018-10-21 DIAGNOSIS — Z1159 Encounter for screening for other viral diseases: Secondary | ICD-10-CM | POA: Insufficient documentation

## 2018-10-21 DIAGNOSIS — Z79899 Other long term (current) drug therapy: Secondary | ICD-10-CM | POA: Insufficient documentation

## 2018-10-21 DIAGNOSIS — N179 Acute kidney failure, unspecified: Secondary | ICD-10-CM | POA: Diagnosis not present

## 2018-10-21 LAB — HEPATIC FUNCTION PANEL
ALT: 14 U/L (ref 0–44)
AST: 15 U/L (ref 15–41)
Albumin: 3 g/dL — ABNORMAL LOW (ref 3.5–5.0)
Alkaline Phosphatase: 53 U/L (ref 38–126)
Bilirubin, Direct: <0.1 mg/dL (ref 0.0–0.2)
Total Bilirubin: 0.5 mg/dL (ref 0.3–1.2)
Total Protein: 5.8 g/dL — ABNORMAL LOW (ref 6.5–8.1)

## 2018-10-21 LAB — BASIC METABOLIC PANEL
Anion gap: 11 (ref 5–15)
BUN: 11 mg/dL (ref 6–20)
CO2: 21 mmol/L — ABNORMAL LOW (ref 22–32)
Calcium: 8.8 mg/dL — ABNORMAL LOW (ref 8.9–10.3)
Chloride: 107 mmol/L (ref 98–111)
Creatinine, Ser: 1.03 mg/dL — ABNORMAL HIGH (ref 0.44–1.00)
GFR calc Af Amer: >60 mL/min (ref >60)
GFR calc non Af Amer: >60 mL/min (ref >60)
Glucose, Bld: 118 mg/dL — ABNORMAL HIGH (ref 70–99)
Potassium: 3.8 mmol/L (ref 3.5–5.1)
Sodium: 139 mmol/L (ref 135–145)

## 2018-10-21 LAB — CBC WITH DIFFERENTIAL/PLATELET
Abs Immature Granulocytes: 0.1 10*3/uL — ABNORMAL HIGH (ref 0.00–0.07)
Basophils Absolute: 0 10*3/uL (ref 0.0–0.1)
Basophils Relative: 0 %
Eosinophils Absolute: 0.1 10*3/uL (ref 0.0–0.5)
Eosinophils Relative: 0 %
HCT: 37.4 % (ref 36.0–46.0)
Hemoglobin: 12.1 g/dL (ref 12.0–15.0)
Immature Granulocytes: 1 %
Lymphocytes Relative: 10 %
Lymphs Abs: 1.5 10*3/uL (ref 0.7–4.0)
MCH: 30.7 pg (ref 26.0–34.0)
MCHC: 32.4 g/dL (ref 30.0–36.0)
MCV: 94.9 fL (ref 80.0–100.0)
Monocytes Absolute: 1.1 10*3/uL — ABNORMAL HIGH (ref 0.1–1.0)
Monocytes Relative: 7 %
Neutro Abs: 12.2 10*3/uL — ABNORMAL HIGH (ref 1.7–7.7)
Neutrophils Relative %: 82 %
Platelets: 214 10*3/uL (ref 150–400)
RBC: 3.94 MIL/uL (ref 3.87–5.11)
RDW: 12.2 % (ref 11.5–15.5)
WBC: 15 10*3/uL — ABNORMAL HIGH (ref 4.0–10.5)
nRBC: 0 % (ref 0.0–0.2)

## 2018-10-21 LAB — TSH: TSH: 0.549 u[IU]/mL (ref 0.350–4.500)

## 2018-10-21 LAB — D-DIMER, QUANTITATIVE: D-Dimer, Quant: 0.39 ug/mL-FEU (ref 0.00–0.50)

## 2018-10-21 LAB — LACTIC ACID, PLASMA: Lactic Acid, Venous: 1.8 mmol/L (ref 0.5–1.9)

## 2018-10-21 LAB — RAPID URINE DRUG SCREEN, HOSP PERFORMED
Amphetamines: NOT DETECTED
Barbiturates: NOT DETECTED
Benzodiazepines: POSITIVE — AB
Cocaine: NOT DETECTED
Opiates: NOT DETECTED
Tetrahydrocannabinol: NOT DETECTED

## 2018-10-21 LAB — SARS CORONAVIRUS 2 BY RT PCR (HOSPITAL ORDER, PERFORMED IN ~~LOC~~ HOSPITAL LAB): SARS Coronavirus 2: NEGATIVE

## 2018-10-21 LAB — PREGNANCY, URINE: Preg Test, Ur: NEGATIVE

## 2018-10-21 LAB — I-STAT TROPONIN, ED: Troponin i, poc: 0.01 ng/mL (ref 0.00–0.08)

## 2018-10-21 LAB — I-STAT BETA HCG BLOOD, ED (MC, WL, AP ONLY): I-stat hCG, quantitative: <5 m[IU]/mL (ref <5)

## 2018-10-21 MED ORDER — SODIUM CHLORIDE 0.9 % IV BOLUS
1000.0000 mL | Freq: Once | INTRAVENOUS | Status: AC
  Administered 2018-10-21: 1000 mL via INTRAVENOUS

## 2018-10-21 MED ORDER — BUSPIRONE HCL 10 MG PO TABS
10.0000 mg | ORAL_TABLET | Freq: Three times a day (TID) | ORAL | Status: DC
  Administered 2018-10-21 – 2018-10-22 (×2): 10 mg via ORAL
  Filled 2018-10-21 (×2): qty 1

## 2018-10-21 MED ORDER — VITAMIN D 25 MCG (1000 UNIT) PO TABS
2000.0000 [IU] | ORAL_TABLET | Freq: Every day | ORAL | Status: DC
  Administered 2018-10-22: 2000 [IU] via ORAL
  Filled 2018-10-21: qty 2

## 2018-10-21 MED ORDER — ONDANSETRON HCL 4 MG/2ML IJ SOLN: 4.0000 mg | Freq: Four times a day (QID) | INTRAMUSCULAR | Status: DC | PRN

## 2018-10-21 MED ORDER — ACETAMINOPHEN 325 MG PO TABS
650.0000 mg | ORAL_TABLET | Freq: Once | ORAL | Status: AC
  Administered 2018-10-21: 650 mg via ORAL
  Filled 2018-10-21: qty 2

## 2018-10-21 MED ORDER — SODIUM CHLORIDE 0.9 % IV SOLN
500.0000 mg | INTRAVENOUS | Status: DC
  Filled 2018-10-21: qty 500

## 2018-10-21 MED ORDER — SODIUM CHLORIDE 0.9 % IV SOLN
500.0000 mg | Freq: Once | INTRAVENOUS | Status: AC
  Administered 2018-10-21: 500 mg via INTRAVENOUS
  Filled 2018-10-21: qty 500

## 2018-10-21 MED ORDER — ACETAMINOPHEN 325 MG PO TABS
650.0000 mg | ORAL_TABLET | Freq: Four times a day (QID) | ORAL | Status: DC | PRN
  Administered 2018-10-22: 650 mg via ORAL
  Filled 2018-10-21: qty 2

## 2018-10-21 MED ORDER — FLUTICASONE PROPIONATE 50 MCG/ACT NA SUSP
1.0000 | Freq: Every day | NASAL | Status: DC
  Administered 2018-10-22: 1 via NASAL
  Filled 2018-10-21: qty 16

## 2018-10-21 MED ORDER — SERTRALINE HCL 100 MG PO TABS
100.0000 mg | ORAL_TABLET | Freq: Every day | ORAL | Status: DC
  Administered 2018-10-22: 100 mg via ORAL
  Filled 2018-10-21: qty 1

## 2018-10-21 MED ORDER — IOHEXOL 350 MG/ML SOLN
100.0000 mL | Freq: Once | INTRAVENOUS | Status: AC | PRN
  Administered 2018-10-21: 100 mL via INTRAVENOUS

## 2018-10-21 MED ORDER — ASENAPINE MALEATE 5 MG SL SUBL
20.0000 mg | SUBLINGUAL_TABLET | Freq: Every day | SUBLINGUAL | Status: DC
  Administered 2018-10-21: 20 mg via SUBLINGUAL
  Filled 2018-10-21 (×2): qty 4

## 2018-10-21 MED ORDER — POLYETHYLENE GLYCOL 3350 17 G PO PACK: 17.0000 g | PACK | Freq: Every day | ORAL | Status: DC | PRN

## 2018-10-21 MED ORDER — ONDANSETRON HCL 4 MG PO TABS: 4.0000 mg | ORAL_TABLET | Freq: Four times a day (QID) | ORAL | Status: DC | PRN

## 2018-10-21 MED ORDER — ACETAMINOPHEN 650 MG RE SUPP: 650.0000 mg | Freq: Four times a day (QID) | RECTAL | Status: DC | PRN

## 2018-10-21 MED ORDER — LORATADINE 10 MG PO TABS
10.0000 mg | ORAL_TABLET | Freq: Every day | ORAL | Status: DC
  Administered 2018-10-22: 10 mg via ORAL
  Filled 2018-10-21: qty 1

## 2018-10-21 MED ORDER — CLONAZEPAM 0.5 MG PO TABS
1.0000 mg | ORAL_TABLET | Freq: Three times a day (TID) | ORAL | Status: DC
  Administered 2018-10-21 – 2018-10-22 (×2): 1 mg via ORAL
  Filled 2018-10-21 (×2): qty 2

## 2018-10-21 MED ORDER — PANTOPRAZOLE SODIUM 40 MG PO TBEC
40.0000 mg | DELAYED_RELEASE_TABLET | Freq: Every day | ORAL | Status: DC
  Administered 2018-10-22: 40 mg via ORAL
  Filled 2018-10-21: qty 1

## 2018-10-21 MED ORDER — HYDROXYZINE PAMOATE 50 MG PO CAPS
50.0000 mg | ORAL_CAPSULE | Freq: Three times a day (TID) | ORAL | Status: DC | PRN
  Filled 2018-10-21: qty 1

## 2018-10-21 MED ORDER — DIVALPROEX SODIUM 250 MG PO DR TAB
750.0000 mg | DELAYED_RELEASE_TABLET | Freq: Every day | ORAL | Status: DC
  Administered 2018-10-21: 750 mg via ORAL
  Filled 2018-10-21 (×3): qty 3

## 2018-10-21 MED ORDER — SODIUM CHLORIDE 0.9 % IV SOLN
1.0000 g | Freq: Once | INTRAVENOUS | Status: AC
  Administered 2018-10-21: 1 g via INTRAVENOUS
  Filled 2018-10-21: qty 10

## 2018-10-21 MED ORDER — ENOXAPARIN SODIUM 40 MG/0.4ML ~~LOC~~ SOLN
40.0000 mg | Freq: Every day | SUBCUTANEOUS | Status: DC
  Administered 2018-10-21: 40 mg via SUBCUTANEOUS
  Filled 2018-10-21: qty 0.4

## 2018-10-21 MED ORDER — BENZTROPINE MESYLATE 1 MG PO TABS
0.5000 mg | ORAL_TABLET | Freq: Two times a day (BID) | ORAL | Status: DC
  Administered 2018-10-21 – 2018-10-22 (×2): 0.5 mg via ORAL
  Filled 2018-10-21 (×2): qty 1

## 2018-10-21 MED ORDER — SODIUM CHLORIDE 0.9 % IV SOLN
1.0000 g | INTRAVENOUS | Status: DC
  Filled 2018-10-21: qty 10

## 2018-10-21 MED ORDER — DOXEPIN HCL 25 MG PO CAPS
75.0000 mg | ORAL_CAPSULE | Freq: Two times a day (BID) | ORAL | Status: DC
  Administered 2018-10-21 – 2018-10-22 (×2): 75 mg via ORAL
  Filled 2018-10-21: qty 1
  Filled 2018-10-21 (×2): qty 3
  Filled 2018-10-21: qty 1
  Filled 2018-10-21: qty 3

## 2018-10-21 MED ORDER — HYDROXYZINE HCL 25 MG PO TABS: 50.0000 mg | ORAL_TABLET | Freq: Three times a day (TID) | ORAL | Status: DC | PRN

## 2018-10-21 NOTE — ED Notes (Signed)
Per floor Nurse to call back with in 5-10 minutes. Charge unavailable to take report at this time.

## 2018-10-21 NOTE — Telephone Encounter (Signed)
Patient's mom notified and will take her to [facility] ED.

## 2018-10-21 NOTE — ED Notes (Signed)
Bedside report given to Thurston Hole RN at [facility]

## 2018-10-21 NOTE — ED Notes (Signed)
ED TO INPATIENT HANDOFF REPORT  ED Nurse Name and Phone #:  Naija Troost [phone]  S Name/Age/Gender Dana Mcneil 28 y.o. female Room/Bed: 017C/017C  Code Status   Code Status: Prior  Home/SNF/Other Home Patient oriented to: self Is this baseline? Yes  - mother at bedside  Triage Complete: Triage complete  Chief Complaint SOB; Elevated HR  Triage Note Pt arrives from home. Complaint of shortness of breath and tachycardia since last night. Pt guardian states that she noticed her breathing was shallow last night. Pt SPo2 94% this morning. Pt did receive inhaler last night approx. 2200.   Allergies Allergies  Allergen Reactions  . Haldol [Haloperidol Lactate]     hyperactive  . Lorazepam     Ativan- hyperactive    Level of Care/Admitting Diagnosis ED Disposition    ED Disposition Condition Comment   Admit  Hospital Area: [facility] [[id number]]  Level of Care: Telemetry Medical [104]  I expect the patient will be discharged within 24 hours: No (not a candidate for 5C-Observation unit)  Covid Evaluation: N/A  Diagnosis: CAP (community acquired pneumonia) [381017]  Admitting Physician: Eduard Clos [phone]  Attending Physician: Eduard Clos [[id number]  PT Class (Do Not Modify): Observation [104]  PT Acc Code (Do Not Modify): Observation [10022]       B Medical/Surgery History Past Medical History:  Diagnosis Date  . [name]   . MR (mental retardation)    History reviewed. No pertinent surgical history.   A IV Location/Drains/Wounds Patient Lines/Drains/Airways Status   Active Line/Drains/Airways    Name:   Placement date:   Placement time:   Site:   Days:   Peripheral IV 10/21/18 Right Antecubital   10/21/18    1108    Antecubital   less than 1   Wound / Incision (Open or Dehisced) 10/01/18 Laceration Head Upper laceration to crown of head   10/01/18    1400    Head   20          Intake/Output Last 24  hours  Intake/Output Summary (Last 24 hours) at 10/21/2018 2133 Last data filed at 10/21/2018 1945 Gross per 24 hour  Intake 2000 ml  Output -  Net 2000 ml    Labs/Imaging Results for orders placed or performed during the hospital encounter of 10/21/18 (from the past 48 hour(s))  Basic metabolic panel     Status: Abnormal   Collection Time: 10/21/18 11:26 AM  Result Value Ref Range   Sodium 139 135 - 145 mmol/L   Potassium 3.8 3.5 - 5.1 mmol/L   Chloride 107 98 - 111 mmol/L   CO2 21 (L) 22 - 32 mmol/L   Glucose, Bld 118 (H) 70 - 99 mg/dL   BUN 11 6 - 20 mg/dL   Creatinine, Ser 5.85 (H) 0.44 - 1.00 mg/dL   Calcium 8.8 (L) 8.9 - 10.3 mg/dL   GFR calc non Af Amer >60 >60 mL/min   GFR calc Af Amer >60 >60 mL/min   Anion gap 11 5 - 15    Comment: Performed at [facility], [address] [place], [place] [address]  CBC with Differential     Status: Abnormal   Collection Time: 10/21/18 11:26 AM  Result Value Ref Range   WBC 15.0 (H) 4.0 - 10.5 K/uL   RBC 3.94 3.87 - 5.11 MIL/uL   Hemoglobin 12.1 12.0 - 15.0 g/dL   HCT 42.3 53.6 - 14.4 %  MCV 94.9 80.0 - 100.0 fL   MCH 30.7 26.0 - 34.0 pg   MCHC 32.4 30.0 - 36.0 g/dL   RDW 40.912.2 81.111.5 - 91.415.5 %   Platelets 214 150 - 400 K/uL   nRBC 0.0 0.0 - 0.2 %   Neutrophils Relative % 82 %   Neutro Abs 12.2 (H) 1.7 - 7.7 K/uL   Lymphocytes Relative 10 %   Lymphs Abs 1.5 0.7 - 4.0 K/uL   Monocytes Relative 7 %   Monocytes Absolute 1.1 (H) 0.1 - 1.0 K/uL   Eosinophils Relative 0 %   Eosinophils Absolute 0.1 0.0 - 0.5 K/uL   Basophils Relative 0 %   Basophils Absolute 0.0 0.0 - 0.1 K/uL   Immature Granulocytes 1 %   Abs Immature Granulocytes 0.10 (H) 0.00 - 0.07 K/uL    Comment: Performed at [facility], [address] [place], [place] [address]  D-dimer, quantitative (not at [facility])     Status: None   Collection Time: 10/21/18  1:06 PM  Result Value Ref Range   D-Dimer, Quant 0.39 0.00 - 0.50 ug/mL-FEU    Comment: (NOTE) At  the manufacturer cut-off of 0.50 ug/mL FEU, this assay has been documented to exclude PE with a sensitivity and negative predictive value of 97 to 99%.  At this time, this assay has not been approved by the FDA to exclude DVT/VTE. Results should be correlated with clinical presentation. Performed at [facility], [address] [place], [place] [address]   SARS Coronavirus 2 (CEPHEID- Performed in [facility] hospital lab), Hosp Order     Status: None   Collection Time: 10/21/18  3:01 PM  Result Value Ref Range   SARS Coronavirus 2 NEGATIVE NEGATIVE    Comment: (NOTE) If result is NEGATIVE SARS-CoV-2 target nucleic acids are NOT DETECTED. The SARS-CoV-2 RNA is generally detectable in upper and lower  respiratory specimens during the acute phase of infection. The lowest  concentration of SARS-CoV-2 viral copies this assay can detect is 250  copies / mL. A negative result does not preclude SARS-CoV-2 infection  and should not be used as the sole basis for treatment or other  patient management decisions.  A negative result may occur with  improper specimen collection / handling, submission of specimen other  than nasopharyngeal swab, presence of viral mutation(s) within the  areas targeted by this assay, and inadequate number of viral copies  (<250 copies / mL). A negative result must be combined with clinical  observations, patient history, and epidemiological information. If result is POSITIVE SARS-CoV-2 target nucleic acids are DETECTED. The SARS-CoV-2 RNA is generally detectable in upper and lower  respiratory specimens dur ing the acute phase of infection.  Positive  results are indicative of active infection with SARS-CoV-2.  Clinical  correlation with patient history and other diagnostic information is  necessary to determine patient infection status.  Positive results do  not rule out bacterial infection or co-infection with other viruses. If result is PRESUMPTIVE  POSTIVE SARS-CoV-2 nucleic acids MAY BE PRESENT.   A presumptive positive result was obtained on the submitted specimen  and confirmed on repeat testing.  While 2019 novel coronavirus  (SARS-CoV-2) nucleic acids may be present in the submitted sample  additional confirmatory testing may be necessary for epidemiological  and / or clinical management purposes  to differentiate between  SARS-CoV-2 and other Sarbecovirus currently known to infect humans.  If clinically indicated additional testing with an alternate test  methodology [phone]) is advised. The SARS-CoV-2 RNA is generally  detectable in upper and lower respiratory sp ecimens during the acute  phase of infection. The expected result is Negative. Fact Sheet for Patients:  BoilerBrush.com.cy Fact Sheet for Healthcare Providers: https://pope.com/ This test is not yet approved or cleared by the [place] FDA and has been authorized for detection and/or diagnosis of SARS-CoV-2 by FDA under an Emergency Use Authorization (EUA).  This EUA will remain in effect (meaning this test can be used) for the duration of the COVID-19 declaration under Section 564(b)(1) of the Act, 21 U.S.C. section 360bbb-3(b)(1), unless the authorization is terminated or revoked sooner. Performed at [facility], [address] [place], [place] [address]   Lactic acid, plasma     Status: None   Collection Time: 10/21/18  4:00 PM  Result Value Ref Range   Lactic Acid, Venous 1.8 0.5 - 1.9 mmol/L    Comment: Performed at [facility], [address] [place], [place] [address]  TSH     Status: None   Collection Time: 10/21/18  4:00 PM  Result Value Ref Range   TSH 0.549 0.350 - 4.500 uIU/mL    Comment: Performed by a 3rd Generation assay with a functional sensitivity of <=0.01 uIU/mL. Performed at [facility], [address], [place], [place] [address]   Hepatic function panel      Status: Abnormal   Collection Time: 10/21/18  4:00 PM  Result Value Ref Range   Total Protein 5.8 (L) 6.5 - 8.1 g/dL   Albumin 3.0 (L) 3.5 - 5.0 g/dL   AST 15 15 - 41 U/L   ALT 14 0 - 44 U/L   Alkaline Phosphatase 53 38 - 126 U/L   Total Bilirubin 0.5 0.3 - 1.2 mg/dL   Bilirubin, Direct <9.9 0.0 - 0.2 mg/dL   Indirect Bilirubin NOT CALCULATED 0.3 - 0.9 mg/dL    Comment: Performed at [facility], [address] [place], [place] [address]  I-stat troponin, ED     Status: None   Collection Time: 10/21/18  4:08 PM  Result Value Ref Range   Troponin i, poc 0.01 0.00 - 0.08 ng/mL   Comment 3            Comment: Due to the release kinetics of cTnI, a negative result within the first hours of the onset of symptoms does not rule out myocardial infarction with certainty. If myocardial infarction is still suspected, repeat the test at appropriate intervals.   I-Stat Beta hCG blood, ED ([facility], [facility], [facility] only)     Status: None   Collection Time: 10/21/18  4:11 PM  Result Value Ref Range   I-stat hCG, quantitative <5.0 <5 mIU/mL   Comment 3            Comment:   GEST. AGE      CONC.  (mIU/mL)   <=1 WEEK        5 - 50     2 WEEKS       50 - 500     3 WEEKS       100 - 10,000     4 WEEKS     1,000 - 30,000        FEMALE AND NON-PREGNANT FEMALE:     LESS THAN 5 mIU/mL   Pregnancy, urine     Status: None   Collection Time: 10/21/18  4:50 PM  Result Value Ref  Range   Preg Test, Ur NEGATIVE NEGATIVE    Comment:        THE SENSITIVITY OF THIS METHODOLOGY IS >20 mIU/mL. Performed at [facility], [address] [place], [place] [address]   Rapid urine drug screen (hospital performed)     Status: Abnormal   Collection Time: 10/21/18  4:55 PM  Result Value Ref Range   Opiates NONE DETECTED NONE DETECTED   Cocaine NONE DETECTED NONE DETECTED   Benzodiazepines POSITIVE (A) NONE DETECTED   Amphetamines NONE DETECTED NONE DETECTED   Tetrahydrocannabinol NONE DETECTED NONE DETECTED    Barbiturates NONE DETECTED NONE DETECTED    Comment: (NOTE) DRUG SCREEN FOR MEDICAL PURPOSES ONLY.  IF CONFIRMATION IS NEEDED FOR ANY PURPOSE, NOTIFY LAB WITHIN 5 DAYS. LOWEST DETECTABLE LIMITS FOR URINE DRUG SCREEN Drug Class                     Cutoff (ng/mL) Amphetamine and metabolites    1000 Barbiturate and metabolites    200 Benzodiazepine                 200 Tricyclics and metabolites     300 Opiates and metabolites        300 Cocaine and metabolites        300 THC                            50 Performed at [facility], [address] [place], [place] [address]    Ct Angio Chest Pe W/cm &/or Wo Cm  Result Date: 10/21/2018 CLINICAL DATA:  Shortness of breath and tachycardia since last night. EXAM: CT ANGIOGRAPHY CHEST CT ABDOMEN AND PELVIS WITH CONTRAST TECHNIQUE: Multidetector CT imaging of the chest was performed using the standard protocol during bolus administration of intravenous contrast. Multiplanar CT image reconstructions and MIPs were obtained to evaluate the vascular anatomy. Multidetector CT imaging of the abdomen and pelvis was performed using the standard protocol during bolus administration of intravenous contrast. CONTRAST:  OMNIPAQUE IOHEXOL 350 MG/ML SOLN COMPARISON:  Portable chest obtained earlier today. FINDINGS: CTA CHEST FINDINGS Cardiovascular: Satisfactory opacification of the pulmonary arteries to the segmental level. No evidence of pulmonary embolism. Normal heart size. No pericardial effusion. Mediastinum/Nodes: Tortuous, gas-filled esophagus. No enlarged lymph nodes. The included portion of the thyroid gland appears normal. Lungs/Pleura: Mild patchy opacity in the left upper lobe and minimal patchy opacity in the right upper lobe. Otherwise, clear lungs. No pleural fluid. Musculoskeletal: Mild thoracic spine degenerative changes. Review of the MIP images confirms the above findings. CT ABDOMEN and PELVIS FINDINGS Hepatobiliary: No focal liver  abnormality is seen. No gallstones, gallbladder wall thickening, or biliary dilatation. Pancreas: Unremarkable. No pancreatic ductal dilatation or surrounding inflammatory changes. Spleen: Normal in size without focal abnormality. Adrenals/Urinary Tract: Adrenal glands are unremarkable. Kidneys are normal, without renal calculi, focal lesion, or hydronephrosis. Bladder is unremarkable. Stomach/Bowel: Unremarkable stomach, small bowel and colon. No evidence of appendicitis. Vascular/Lymphatic: No significant vascular findings are present. No enlarged abdominal or pelvic lymph nodes. Reproductive: Small uterus. No adnexal mass. Other: Tiny umbilical hernia containing fat. Small amount of free peritoneal fluid in the pelvis, within normal limits of physiological fluid. Musculoskeletal: Small Schmorl's nodes in the lower thoracic spine. Review of the MIP images confirms the above findings. IMPRESSION: 1. No pulmonary emboli. 2. Mild patchy opacity in the left upper lobe and minimal patchy opacity in  the right upper lobe, compatible with pneumonia. 3. No acute abdominal or pelvic abnormality. Electronically Signed   By: Beckie Salts M.D.   On: 10/21/2018 18:23   Ct Abdomen Pelvis W Contrast  Result Date: 10/21/2018 CLINICAL DATA:  Shortness of breath and tachycardia since last night. EXAM: CT ANGIOGRAPHY CHEST CT ABDOMEN AND PELVIS WITH CONTRAST TECHNIQUE: Multidetector CT imaging of the chest was performed using the standard protocol during bolus administration of intravenous contrast. Multiplanar CT image reconstructions and MIPs were obtained to evaluate the vascular anatomy. Multidetector CT imaging of the abdomen and pelvis was performed using the standard protocol during bolus administration of intravenous contrast. CONTRAST:  OMNIPAQUE IOHEXOL 350 MG/ML SOLN COMPARISON:  Portable chest obtained earlier today. FINDINGS: CTA CHEST FINDINGS Cardiovascular: Satisfactory opacification of the pulmonary arteries  to the segmental level. No evidence of pulmonary embolism. Normal heart size. No pericardial effusion. Mediastinum/Nodes: Tortuous, gas-filled esophagus. No enlarged lymph nodes. The included portion of the thyroid gland appears normal. Lungs/Pleura: Mild patchy opacity in the left upper lobe and minimal patchy opacity in the right upper lobe. Otherwise, clear lungs. No pleural fluid. Musculoskeletal: Mild thoracic spine degenerative changes. Review of the MIP images confirms the above findings. CT ABDOMEN and PELVIS FINDINGS Hepatobiliary: No focal liver abnormality is seen. No gallstones, gallbladder wall thickening, or biliary dilatation. Pancreas: Unremarkable. No pancreatic ductal dilatation or surrounding inflammatory changes. Spleen: Normal in size without focal abnormality. Adrenals/Urinary Tract: Adrenal glands are unremarkable. Kidneys are normal, without renal calculi, focal lesion, or hydronephrosis. Bladder is unremarkable. Stomach/Bowel: Unremarkable stomach, small bowel and colon. No evidence of appendicitis. Vascular/Lymphatic: No significant vascular findings are present. No enlarged abdominal or pelvic lymph nodes. Reproductive: Small uterus. No adnexal mass. Other: Tiny umbilical hernia containing fat. Small amount of free peritoneal fluid in the pelvis, within normal limits of physiological fluid. Musculoskeletal: Small Schmorl's nodes in the lower thoracic spine. Review of the MIP images confirms the above findings. IMPRESSION: 1. No pulmonary emboli. 2. Mild patchy opacity in the left upper lobe and minimal patchy opacity in the right upper lobe, compatible with pneumonia. 3. No acute abdominal or pelvic abnormality. Electronically Signed   By: Beckie Salts M.D.   On: 10/21/2018 18:23   Dg Chest Port 1 View  Result Date: 10/21/2018 CLINICAL DATA:  Shortness of breath EXAM: PORTABLE CHEST 1 VIEW COMPARISON:  Portable exam 1150 hours compared to 02/20/2018 FINDINGS: Normal heart size and  pulmonary vascularity. Tubular gas structure within the mediastinum likely reflects an air-filled distended esophagus. Gaseous distention of the proximal stomach as well. Decreased lung volumes with mild RIGHT basilar atelectasis. Upper lungs clear. No pleural effusion or pneumothorax. IMPRESSION: Gaseous distention of the esophagus and proximal stomach. Low lung volumes with RIGHT basilar atelectasis. Electronically Signed   By: Ulyses Southward M.D.   On: 10/21/2018 12:36   Vas [place] Lower Extremity Venous (dvt) (only Mc & Wl)  Result Date: 10/21/2018  Lower Venous Study Indications: Swelling.  Performing Technologist: Chanda Busing RVT  Examination Guidelines: A complete evaluation includes B-mode imaging, spectral Doppler, color Doppler, and power Doppler as needed of all accessible portions of each vessel. Bilateral testing is considered an integral part of a complete examination. Limited examinations for reoccurring indications may be performed as noted.  +---------+---------------+---------+-----------+----------+-------+ RIGHT    CompressibilityPhasicitySpontaneityPropertiesSummary +---------+---------------+---------+-----------+----------+-------+ CFV      Full           Yes      Yes                          +---------+---------------+---------+-----------+----------+-------+  SFJ      Full                                                 +---------+---------------+---------+-----------+----------+-------+ FV Prox  Full                                                 +---------+---------------+---------+-----------+----------+-------+ FV Mid   Full                                                 +---------+---------------+---------+-----------+----------+-------+ FV DistalFull                                                 +---------+---------------+---------+-----------+----------+-------+ PFV      Full                                                  +---------+---------------+---------+-----------+----------+-------+ POP      Full           Yes      Yes                          +---------+---------------+---------+-----------+----------+-------+ PTV      Full                                                 +---------+---------------+---------+-----------+----------+-------+ PERO     Full                                                 +---------+---------------+---------+-----------+----------+-------+   +---------+---------------+---------+-----------+----------+-------+ LEFT     CompressibilityPhasicitySpontaneityPropertiesSummary +---------+---------------+---------+-----------+----------+-------+ CFV      Full           Yes      Yes                          +---------+---------------+---------+-----------+----------+-------+ SFJ      Full                                                 +---------+---------------+---------+-----------+----------+-------+ FV Prox  Full                                                 +---------+---------------+---------+-----------+----------+-------+  FV Mid   Full                                                 +---------+---------------+---------+-----------+----------+-------+ FV DistalFull                                                 +---------+---------------+---------+-----------+----------+-------+ PFV      Full                                                 +---------+---------------+---------+-----------+----------+-------+ POP      Full           Yes      Yes                          +---------+---------------+---------+-----------+----------+-------+ PTV      Full                                                 +---------+---------------+---------+-----------+----------+-------+ PERO     Full                                                 +---------+---------------+---------+-----------+----------+-------+     Summary:  Right: There is no evidence of deep vein thrombosis in the lower extremity. No cystic structure found in the popliteal fossa. Left: There is no evidence of deep vein thrombosis in the lower extremity. No cystic structure found in the popliteal fossa.  *See table(s) above for measurements and observations. Electronically signed by Sherald Hess MD on 10/21/2018 at 5:31:53 PM.    Final     Pending Labs Unresulted Labs (From admission, onward)    Start     Ordered   10/21/18 1542  Lactic acid, plasma  Now then every 2 hours,   STAT     10/21/18 1541   10/21/18 1542  Blood culture (routine x 2)  BLOOD CULTURE X 2,   STAT     10/21/18 1541   10/21/18 1542  Urine culture  ONCE - STAT,   STAT     10/21/18 1541          Vitals/Pain Today's Vitals   10/21/18 1715 10/21/18 1943 10/21/18 1944 10/21/18 2000  BP: 124/62 (!) 91/54  (!) 119/91  Pulse: 99 99  (!) 107  Resp: (!) 22 (!) 22  (!) 27  Temp:      TempSrc:      SpO2: 97% 95%  95%  Weight:      Height:      PainSc:   0-No pain     Isolation Precautions No active isolations  Medications Medications  azithromycin (ZITHROMAX) 500 mg in sodium chloride 0.9 % 250 mL IVPB (500 mg Intravenous New Bag/Given 10/21/18 2115)  acetaminophen (TYLENOL) tablet 650 mg (  650 mg Oral Given 10/21/18 1356)  sodium chloride 0.9 % bolus 1,000 mL (0 mLs Intravenous Stopped 10/21/18 1651)  sodium chloride 0.9 % bolus 1,000 mL (0 mLs Intravenous Stopped 10/21/18 1945)  iohexol (OMNIPAQUE) 350 MG/ML injection 100 mL (100 mLs Intravenous Contrast Given 10/21/18 1750)  cefTRIAXone (ROCEPHIN) 1 g in sodium chloride 0.9 % 100 mL IVPB (0 g Intravenous Stopped 10/21/18 2114)    Mobility  Low fall risk   Focused Assessments Pulmonary Assessment Handoff:  Lung sounds: L Breath Sounds: Clear, Diminished R Breath Sounds: Clear, Diminished O2 Device: Room Air(Simultaneous filing. User may not have seen previous data.)        R Recommendations: See Admitting  Provider Note  Report given to:   Additional Notes:

## 2018-10-21 NOTE — ED Provider Notes (Signed)
  Physical Exam  BP (!) 119/91   Pulse (!) 107   Temp 98.5 F (36.9 C) (Oral) Comment: Simultaneous filing. User may not have seen previous data. Comment (Src): Simultaneous filing. User may not have seen previous data.  Resp (!) 27   Ht 4\' 11"  (1.499 m)   Wt 75 kg   SpO2 95%   BMI 33.40 kg/m   Physical Exam  ED Course/Procedures     Procedures  MDM   Medical Decision Making:  Dana Mcneil is a 28 y.o. female who presents with SOB and tachycardia. PMH significant for [name], MR, behavioral issues, anxiety  Reviewed and confirmed nursing documentation for past medical history, family history, social history Persistently tachycardic. CT scans show multifocal PNA. Significant leukocytosis. Will Rx with Abx.  All radiology and laboratory studies reviewed independently and with my attending physician, agree with reading provided by radiologist unless otherwise noted.  Upon reassessing patient, patient was calm, resting comfortably.  Based on the above findings, I believe patient requires admission.  Pt admitted.   Emergency Department Medication Summary:  Medications  azithromycin (ZITHROMAX) 500 mg in sodium chloride 0.9 % 250 mL IVPB (has no administration in time range)  acetaminophen (TYLENOL) tablet 650 mg (650 mg Oral Given 10/21/18 1356)  sodium chloride 0.9 % bolus 1,000 mL (0 mLs Intravenous Stopped 10/21/18 1651)  sodium chloride 0.9 % bolus 1,000 mL (0 mLs Intravenous Stopped 10/21/18 1945)  iohexol (OMNIPAQUE) 350 MG/ML injection 100 mL (100 mLs Intravenous Contrast Given 10/21/18 1750)  cefTRIAXone (ROCEPHIN) 1 g in sodium chloride 0.9 % 100 mL IVPB (1 g Intravenous New Bag/Given 10/21/18 2013)       Clinical Impression:  1. Shortness of breath   2. Community acquired pneumonia, unspecified laterality            Erick Alley, MD 10/21/18 2108    Charlynne Pander, MD 10/28/18 [phone]
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
## 2018-10-21 NOTE — Telephone Encounter (Signed)
Patient mom called this am and states that they had to call EMS last night for Dana Mcneil's labored breathing, moaning, PO 86, pulse 127 by EMS, when they left is was PO 93 and pulse 112.   EMS states that she is wheezing in the Left upper lobe, crackling in other lobes, cough, gurgling and is stuffy this morning possible fever.  Questioning if she needs to be tested for Covid-19?  Patient states that she stuffy and chest hurts.

## 2018-10-21 NOTE — H&P (Addendum)
History and Physical    Dana Mcneil LKG:[id number] DOB: [birth year] DOA: 10/21/2018  PCP: Jac Canavan, PA-C  Patient coming from: Home.  Chief Complaint: Shortness of breath tachycardia.  History obtained from patient's mother as patient has intellectual disability.  HPI: Dana Mcneil is a 28 y.o. female with history of intellectual disability and recently found of increasing bilateral lower extremity edema followed by cardiologist 2D echo done in March 2020 was showing normal EF was brought to the ER after patient became more short of breath tachycardic with easily desaturating on ambulation over the last 3 days.  4 days ago patient also had an episode of nausea vomiting.  No fever chills and as per the patient's mother patient usually does not get febrile.  Due to persistent symptoms patient was brought to the ER.  ED Course: In the ER patient is found to be tachycardic with heart rate around 125 he has bilateral lower extremity edema.  Dopplers were negative for DVT.  EKG shows sinus tachycardia.  Patient does have history of sinus tachycardia but patient mother states is more than usual.  Blood work showed WBC count of 15 creatinine 1.03 increase from baseline LFTs are largely unremarkable.  Given the tachycardia and hypoxia patient underwent CT angiogram of the chest abdomen and pelvis.  Which shows bilateral pneumonia abdomen was unremarkable.  Patient was started on empiric antibiotics for community-acquired pneumonia and was given 1 L fluid bolus.  On exam patient does have bilateral lower extremity edema.  COVID-19 was negative.  Review of Systems: As per HPI, rest all negative.   Past Medical History:  Diagnosis Date   [name]    MR (mental retardation)     History reviewed. No pertinent surgical history.   reports that she has never smoked. She has never used smokeless tobacco. She reports that she does not drink alcohol or use  drugs.  Allergies  Allergen Reactions   Haldol [Haloperidol Lactate]     hyperactive   Lorazepam     Ativan- hyperactive    Family History  Problem Relation Age of Onset   Diabetes Father    Cancer Maternal Grandfather    Hyperlipidemia Other    Hypertension Other    Colon cancer Neg Hx    Colon polyps Neg Hx    Kidney disease Neg Hx    Esophageal cancer Neg Hx     Prior to Admission medications   Medication Sig Start Date End Date Taking? Authorizing Provider  Asenapine Maleate 10 MG SUBL Place 20 mg under the tongue at bedtime.    Yes [provider]  benztropine (COGENTIN) 0.5 MG tablet Take 0.5 mg by mouth 2 (two) times daily.   Yes [provider]  busPIRone (BUSPAR) 10 MG tablet Take 10 mg by mouth 3 (three) times daily.   Yes [provider]  cetirizine (ZYRTEC) 10 MG tablet Take 10 mg by mouth daily.   Yes [provider]  Cholecalciferol (VITAMIN D3) 2000 units capsule Take 2,000 Units by mouth daily.   Yes [provider]  clonazePAM (KLONOPIN) 1 MG tablet Take 1 mg by mouth 3 (three) times daily.   Yes [provider]  divalproex (DEPAKOTE) 250 MG DR tablet Take 750 mg by mouth at bedtime.    Yes [provider]  doxepin (SINEQUAN) 75 MG capsule Take 75 mg by mouth 2 (two) times a day.    Yes [provider]  fluticasone (  FLONASE) 50 MCG/ACT nasal spray Place 1 spray into both nostrils daily. 02/20/18  Yes Petrucelli, Samantha R, PA-C  hydrOXYzine (VISTARIL) 50 MG capsule Take 50 mg by mouth 3 (three) times daily as needed for anxiety.   Yes [provider]  omeprazole (PRILOSEC) 40 MG capsule Take 1 capsule (40 mg total) by mouth daily. 06/21/18 06/21/19 Yes Tysinger, Kermit Balo, PA-C  polyethylene glycol powder (GLYCOLAX/MIRALAX) powder 1 cap full daily in a beverage in the morning Patient taking differently: Take 17 g by mouth daily as needed. 1 cap full daily in a beverage in the  morning as needed 06/18/18  Yes Tysinger, Kermit Balo, PA-C  PRESCRIPTION MEDICATION MI Paste Plus. -- Apply pea size amount nightly with finger or Q tip after flossing and brushing until gone   Yes [provider]  sertraline (ZOLOFT) 100 MG tablet Take 100 mg by mouth daily.    Yes [provider]  furosemide (LASIX) 20 MG tablet Take 1 tablet (20 mg total) by mouth daily. Patient not taking: Reported on 10/21/2018 09/16/18   Tysinger, Kermit Balo, PA-C  guaiFENesin (MUCINEX) 600 MG 12 hr tablet Take 1 tablet (600 mg total) by mouth 2 (two) times daily. Patient not taking: Reported on 07/20/2018 04/28/18   Tysinger, Kermit Balo, PA-C  hydrochlorothiazide (HYDRODIURIL) 25 MG tablet Take 1 tablet (25 mg total) by mouth daily. Patient not taking: Reported on 10/21/2018 07/22/18   Tysinger, Kermit Balo, PA-C  potassium chloride (KLOR-CON 10) 10 MEQ tablet Take 1 tablet (10 mEq total) by mouth daily. Patient not taking: Reported on 10/21/2018 09/16/18   Jac Canavan, PA-C    Physical Exam: Vitals:   10/21/18 1715 10/21/18 1943 10/21/18 2000 10/21/18 2130  BP: 124/62 (!) 91/54 (!) 119/91 124/79  Pulse: 99 99 (!) 107 (!) 120  Resp: (!) 22 (!) 22 (!) 27 (!) 28  Temp:      TempSrc:      SpO2: 97% 95% 95% 96%  Weight:      Height:          Constitutional: Moderately built and nourished. Vitals:   10/21/18 1715 10/21/18 1943 10/21/18 2000 10/21/18 2130  BP: 124/62 (!) 91/54 (!) 119/91 124/79  Pulse: 99 99 (!) 107 (!) 120  Resp: (!) 22 (!) 22 (!) 27 (!) 28  Temp:      TempSrc:      SpO2: 97% 95% 95% 96%  Weight:      Height:       Eyes: Anicteric no pallor. ENMT: No discharge from the ears eyes nose and mouth. Neck: No mass felt.  No neck rigidity. Respiratory: No rhonchi or crepitations. Cardiovascular: S1-S2 heard. Abdomen: Soft nontender bowel sounds present. Musculoskeletal: Bilateral lower extremity edema present. Skin: No rash. Neurologic: Alert awake oriented to her name  follows commands moves all extremities. Psychiatric: Oriented to her name.  Has intellectual disability.   Labs on Admission: I have personally reviewed following labs and imaging studies  CBC: Recent Labs  Lab 10/21/18 1126  WBC 15.0*  NEUTROABS 12.2*  HGB 12.1  HCT 37.4  MCV 94.9  PLT 214   Basic Metabolic Panel: Recent Labs  Lab 10/21/18 1126  NA 139  K 3.8  CL 107  CO2 21*  GLUCOSE 118*  BUN 11  CREATININE 1.03*  CALCIUM 8.8*   GFR: Estimated Creatinine Clearance: 71.1 mL/min (A) (by C-G formula based on SCr of 1.03 mg/dL (H)). Liver Function Tests: Recent Labs  Lab 10/21/18  1600  AST 15  ALT 14  ALKPHOS 53  BILITOT 0.5  PROT 5.8*  ALBUMIN 3.0*   No results for input(s): LIPASE, AMYLASE in the last 168 hours. No results for input(s): AMMONIA in the last 168 hours. Coagulation Profile: No results for input(s): INR, PROTIME in the last 168 hours. Cardiac Enzymes: No results for input(s): CKTOTAL, CKMB, CKMBINDEX, TROPONINI in the last 168 hours. BNP (last 3 results) No results for input(s): PROBNP in the last 8760 hours. HbA1C: No results for input(s): HGBA1C in the last 72 hours. CBG: No results for input(s): GLUCAP in the last 168 hours. Lipid Profile: No results for input(s): CHOL, HDL, LDLCALC, TRIG, CHOLHDL, LDLDIRECT in the last 72 hours. Thyroid Function Tests: Recent Labs    10/21/18 1600  TSH 0.549   Anemia Panel: No results for input(s): VITAMINB12, FOLATE, FERRITIN, TIBC, IRON, RETICCTPCT in the last 72 hours. Urine analysis:    Component Value Date/Time   COLORURINE YELLOW 02/03/2014 1619   APPEARANCEUR CLEAR 02/03/2014 1619   LABSPEC 1.020 07/20/2018 1100   PHURINE 7.0 02/03/2014 1619   GLUCOSEU NEGATIVE 02/03/2014 1619   HGBUR NEGATIVE 02/03/2014 1619   BILIRUBINUR negative 07/20/2018 1100   KETONESUR negative 07/20/2018 1100   KETONESUR NEGATIVE 02/03/2014 1619   PROTEINUR negative 07/20/2018 1100   PROTEINUR NEGATIVE  02/03/2014 1619   UROBILINOGEN 1.0 02/03/2014 1619   NITRITE Negative 07/20/2018 1100   NITRITE NEGATIVE 02/03/2014 1619   LEUKOCYTESUR Negative 07/20/2018 1100   Sepsis Labs: (procalcitonin:4,lacticidven:4) ) Recent Results (from the past 240 hour(s))  SARS Coronavirus 2 (CEPHEID- Performed in [facility] hospital lab), Hosp Order     Status: None   Collection Time: 10/21/18  3:01 PM  Result Value Ref Range Status   SARS Coronavirus 2 NEGATIVE NEGATIVE Final    Comment: (NOTE) If result is NEGATIVE SARS-CoV-2 target nucleic acids are NOT DETECTED. The SARS-CoV-2 RNA is generally detectable in upper and lower  respiratory specimens during the acute phase of infection. The lowest  concentration of SARS-CoV-2 viral copies this assay can detect is 250  copies / mL. A negative result does not preclude SARS-CoV-2 infection  and should not be used as the sole basis for treatment or other  patient management decisions.  A negative result may occur with  improper specimen collection / handling, submission of specimen other  than nasopharyngeal swab, presence of viral mutation(s) within the  areas targeted by this assay, and inadequate number of viral copies  (<250 copies / mL). A negative result must be combined with clinical  observations, patient history, and epidemiological information. If result is POSITIVE SARS-CoV-2 target nucleic acids are DETECTED. The SARS-CoV-2 RNA is generally detectable in upper and lower  respiratory specimens dur ing the acute phase of infection.  Positive  results are indicative of active infection with SARS-CoV-2.  Clinical  correlation with patient history and other diagnostic information is  necessary to determine patient infection status.  Positive results do  not rule out bacterial infection or co-infection with other viruses. If result is PRESUMPTIVE POSTIVE SARS-CoV-2 nucleic acids MAY BE PRESENT.   A presumptive positive result was  obtained on the submitted specimen  and confirmed on repeat testing.  While 2019 novel coronavirus  (SARS-CoV-2) nucleic acids may be present in the submitted sample  additional confirmatory testing may be necessary for epidemiological  and / or clinical management purposes  to differentiate between  SARS-CoV-2 and other Sarbecovirus currently known to infect humans.  If clinically  indicated additional testing with an alternate test  methodology [phone]([id number]) is advised. The SARS-CoV-2 RNA is generally  detectable in upper and lower respiratory sp ecimens during the acute  phase of infection. The expected result is Negative. Fact Sheet for Patients:  BoilerBrush.com.cyhttps://www.fda.gov/media/136312/download Fact Sheet for Healthcare Providers: https://pope.com/https://www.fda.gov/media/136313/download This test is not yet approved or cleared by the [place] FDA and has been authorized for detection and/or diagnosis of SARS-CoV-2 by FDA under an Emergency Use Authorization (EUA).  This EUA will remain in effect (meaning this test can be used) for the duration of the COVID-19 declaration under Section 564(b)(1) of the Act, 21 U.S.C. section 360bbb-3(b)(1), unless the authorization is terminated or revoked sooner. Performed at [facility], [address] [place], [place] [address]      Radiological Exams on Admission: Ct Angio Chest Pe W/cm &/or Wo Cm  Result Date: 10/21/2018 CLINICAL DATA:  Shortness of breath and tachycardia since last night. EXAM: CT ANGIOGRAPHY CHEST CT ABDOMEN AND PELVIS WITH CONTRAST TECHNIQUE: Multidetector CT imaging of the chest was performed using the standard protocol during bolus administration of intravenous contrast. Multiplanar CT image reconstructions and MIPs were obtained to evaluate the vascular anatomy. Multidetector CT imaging of the abdomen and pelvis was performed using the standard protocol during bolus administration of intravenous contrast. CONTRAST:  100mL OMNIPAQUE  IOHEXOL 350 MG/ML SOLN COMPARISON:  Portable chest obtained earlier today. FINDINGS: CTA CHEST FINDINGS Cardiovascular: Satisfactory opacification of the pulmonary arteries to the segmental level. No evidence of pulmonary embolism. Normal heart size. No pericardial effusion. Mediastinum/Nodes: Tortuous, gas-filled esophagus. No enlarged lymph nodes. The included portion of the thyroid gland appears normal. Lungs/Pleura: Mild patchy opacity in the left upper lobe and minimal patchy opacity in the right upper lobe. Otherwise, clear lungs. No pleural fluid. Musculoskeletal: Mild thoracic spine degenerative changes. Review of the MIP images confirms the above findings. CT ABDOMEN and PELVIS FINDINGS Hepatobiliary: No focal liver abnormality is seen. No gallstones, gallbladder wall thickening, or biliary dilatation. Pancreas: Unremarkable. No pancreatic ductal dilatation or surrounding inflammatory changes. Spleen: Normal in size without focal abnormality. Adrenals/Urinary Tract: Adrenal glands are unremarkable. Kidneys are normal, without renal calculi, focal lesion, or hydronephrosis. Bladder is unremarkable. Stomach/Bowel: Unremarkable stomach, small bowel and colon. No evidence of appendicitis. Vascular/Lymphatic: No significant vascular findings are present. No enlarged abdominal or pelvic lymph nodes. Reproductive: Small uterus. No adnexal mass. Other: Tiny umbilical hernia containing fat. Small amount of free peritoneal fluid in the pelvis, within normal limits of physiological fluid. Musculoskeletal: Small Schmorl's nodes in the lower thoracic spine. Review of the MIP images confirms the above findings. IMPRESSION: 1. No pulmonary emboli. 2. Mild patchy opacity in the left upper lobe and minimal patchy opacity in the right upper lobe, compatible with pneumonia. 3. No acute abdominal or pelvic abnormality. Electronically Signed   By: Beckie SaltsSteven  Reid M.D.   On: 10/21/2018 18:23   Ct Abdomen Pelvis W  Contrast  Result Date: 10/21/2018 CLINICAL DATA:  Shortness of breath and tachycardia since last night. EXAM: CT ANGIOGRAPHY CHEST CT ABDOMEN AND PELVIS WITH CONTRAST TECHNIQUE: Multidetector CT imaging of the chest was performed using the standard protocol during bolus administration of intravenous contrast. Multiplanar CT image reconstructions and MIPs were obtained to evaluate the vascular anatomy. Multidetector CT imaging of the abdomen and pelvis was performed using the standard protocol during bolus administration of intravenous contrast. CONTRAST:  100mL OMNIPAQUE IOHEXOL 350 MG/ML SOLN COMPARISON:  Portable chest obtained earlier today. FINDINGS: CTA CHEST  FINDINGS Cardiovascular: Satisfactory opacification of the pulmonary arteries to the segmental level. No evidence of pulmonary embolism. Normal heart size. No pericardial effusion. Mediastinum/Nodes: Tortuous, gas-filled esophagus. No enlarged lymph nodes. The included portion of the thyroid gland appears normal. Lungs/Pleura: Mild patchy opacity in the left upper lobe and minimal patchy opacity in the right upper lobe. Otherwise, clear lungs. No pleural fluid. Musculoskeletal: Mild thoracic spine degenerative changes. Review of the MIP images confirms the above findings. CT ABDOMEN and PELVIS FINDINGS Hepatobiliary: No focal liver abnormality is seen. No gallstones, gallbladder wall thickening, or biliary dilatation. Pancreas: Unremarkable. No pancreatic ductal dilatation or surrounding inflammatory changes. Spleen: Normal in size without focal abnormality. Adrenals/Urinary Tract: Adrenal glands are unremarkable. Kidneys are normal, without renal calculi, focal lesion, or hydronephrosis. Bladder is unremarkable. Stomach/Bowel: Unremarkable stomach, small bowel and colon. No evidence of appendicitis. Vascular/Lymphatic: No significant vascular findings are present. No enlarged abdominal or pelvic lymph nodes. Reproductive: Small uterus. No adnexal mass.  Other: Tiny umbilical hernia containing fat. Small amount of free peritoneal fluid in the pelvis, within normal limits of physiological fluid. Musculoskeletal: Small Schmorl's nodes in the lower thoracic spine. Review of the MIP images confirms the above findings. IMPRESSION: 1. No pulmonary emboli. 2. Mild patchy opacity in the left upper lobe and minimal patchy opacity in the right upper lobe, compatible with pneumonia. 3. No acute abdominal or pelvic abnormality. Electronically Signed   By: Beckie Salts M.D.   On: 10/21/2018 18:23   Dg Chest Port 1 View  Result Date: 10/21/2018 CLINICAL DATA:  Shortness of breath EXAM: PORTABLE CHEST 1 VIEW COMPARISON:  Portable exam 1150 hours compared to 02/20/2018 FINDINGS: Normal heart size and pulmonary vascularity. Tubular gas structure within the mediastinum likely reflects an air-filled distended esophagus. Gaseous distention of the proximal stomach as well. Decreased lung volumes with mild RIGHT basilar atelectasis. Upper lungs clear. No pleural effusion or pneumothorax. IMPRESSION: Gaseous distention of the esophagus and proximal stomach. Low lung volumes with RIGHT basilar atelectasis. Electronically Signed   By: Ulyses Southward M.D.   On: 10/21/2018 12:36   Vas [place] Lower Extremity Venous (dvt) (only [facility])  Result Date: 10/21/2018  Lower Venous Study Indications: Swelling.  Performing Technologist: Chanda Busing RVT  Examination Guidelines: A complete evaluation includes B-mode imaging, spectral Doppler, color Doppler, and power Doppler as needed of all accessible portions of each vessel. Bilateral testing is considered an integral part of a complete examination. Limited examinations for reoccurring indications may be performed as noted.  +---------+---------------+---------+-----------+----------+-------+  RIGHT     Compressibility Phasicity Spontaneity Properties Summary  +---------+---------------+---------+-----------+----------+-------+  CFV       Full             Yes       Yes                             +---------+---------------+---------+-----------+----------+-------+  SFJ       Full                                                      +---------+---------------+---------+-----------+----------+-------+  FV Prox   Full                                                      +---------+---------------+---------+-----------+----------+-------+  FV Mid    Full                                                      +---------+---------------+---------+-----------+----------+-------+  FV Distal Full                                                      +---------+---------------+---------+-----------+----------+-------+  PFV       Full                                                      +---------+---------------+---------+-----------+----------+-------+  POP       Full            Yes       Yes                             +---------+---------------+---------+-----------+----------+-------+  PTV       Full                                                      +---------+---------------+---------+-----------+----------+-------+  PERO      Full                                                      +---------+---------------+---------+-----------+----------+-------+   +---------+---------------+---------+-----------+----------+-------+  LEFT      Compressibility Phasicity Spontaneity Properties Summary  +---------+---------------+---------+-----------+----------+-------+  CFV       Full            Yes       Yes                             +---------+---------------+---------+-----------+----------+-------+  SFJ       Full                                                      +---------+---------------+---------+-----------+----------+-------+  FV Prox   Full                                                      +---------+---------------+---------+-----------+----------+-------+  FV Mid    Full                                                       +---------+---------------+---------+-----------+----------+-------+  FV Distal Full                                                      +---------+---------------+---------+-----------+----------+-------+  PFV       Full                                                      +---------+---------------+---------+-----------+----------+-------+  POP       Full            Yes       Yes                             +---------+---------------+---------+-----------+----------+-------+  PTV       Full                                                      +---------+---------------+---------+-----------+----------+-------+  PERO      Full                                                      +---------+---------------+---------+-----------+----------+-------+     Summary: Right: There is no evidence of deep vein thrombosis in the lower extremity. No cystic structure found in the popliteal fossa. Left: There is no evidence of deep vein thrombosis in the lower extremity. No cystic structure found in the popliteal fossa.  *See table(s) above for measurements and observations. Electronically signed by Sherald Hess MD on 10/21/2018 at 5:31:53 PM.    Final     EKG: Independently reviewed.  Sinus tachycardia.  Assessment/Plan Principal Problem:   CAP (community acquired pneumonia) Active Problems:   Generalized anxiety disorder   Intellectual disability    1. Community-acquired pneumonia -patient has been placed empirically on antibiotics.  Follow blood cultures urine for strep antigen and Legionella.  COVID-19 was negative. 2. Sinus tachycardia appears to be chronic but more than usual.  Likely precipitated by pneumonia.  Will check TSH. 3. Intellectual disability and general anxiety disorder on multiple medications which should be continued.  Medications were reviewed with patient's mother. 4. Acute renal failure could be from prerenal from vomiting.  Patient did receive fluid in the ER 1 L bolus.  Given the  lower extremity mobility be cautious and more fluids. 5. Bilateral lower extremity edema appears to be chronic.  Has been followed by cardiologist in March 2020.  2D echo was unremarkable.  DVT studies today was negative.  Patient on 2 g salt diet.  Will check BNP. 6. That patient's albumin is 3.  Need to recheck.  Per patient's mother patient has followed up with nephrologist previously.  Check UA.   DVT prophylaxis: Lovenox. Code Status: Full code confirmed with patient's mother. Family Communication: Patient's mother. Disposition Plan: Home. Consults called: None. Admission status: Observation.   Eduard Clos MD  [facility] Pager [phone].  If 7PM-7AM, please contact night-coverage [email or web address] Password [email or web address]  10/21/2018, 9:35 PM

## 2018-10-21 NOTE — ED Triage Notes (Signed)
Pt arrives from home. Complaint of shortness of breath and tachycardia since last night. Pt guardian states that she noticed her breathing was shallow last night. Pt SPo2 94% this morning. Pt did receive inhaler last night approx. 2200.

## 2018-10-21 NOTE — Telephone Encounter (Signed)
She should be seen in the emergency dept now, [facility].   Needs to have covid test rapid, but also possibly may need CT chest to rule out pulmonary embolism.

## 2018-10-21 NOTE — Progress Notes (Signed)
Bilateral lower extremity venous duplex has been completed. Preliminary results can be found in CV Proc through chart review.  Results were given to Terance Hart PA.   10/21/18 1:52 PM Olen Cordial RVT

## 2018-10-21 NOTE — ED Notes (Signed)
Patient ambulated approximately 10 feet. Patient's oxygen saturation stayed between 93-100% on room air.

## 2018-10-21 NOTE — ED Notes (Signed)
Attempted to call report to [id number]. Nurse not available for report to try back in 5-10 minutes

## 2018-10-22 ENCOUNTER — Observation Stay (HOSPITAL_COMMUNITY): Payer: Medicaid Other

## 2018-10-22 DIAGNOSIS — F411 Generalized anxiety disorder: Secondary | ICD-10-CM | POA: Diagnosis not present

## 2018-10-22 DIAGNOSIS — R140 Abdominal distension (gaseous): Secondary | ICD-10-CM

## 2018-10-22 DIAGNOSIS — J189 Pneumonia, unspecified organism: Secondary | ICD-10-CM | POA: Diagnosis not present

## 2018-10-22 DIAGNOSIS — J181 Lobar pneumonia, unspecified organism: Secondary | ICD-10-CM | POA: Diagnosis not present

## 2018-10-22 LAB — TSH: TSH: 0.877 u[IU]/mL (ref 0.350–4.500)

## 2018-10-22 LAB — COMPREHENSIVE METABOLIC PANEL
ALT: 13 U/L (ref 0–44)
AST: 11 U/L — ABNORMAL LOW (ref 15–41)
Albumin: 2.9 g/dL — ABNORMAL LOW (ref 3.5–5.0)
Alkaline Phosphatase: 52 U/L (ref 38–126)
Anion gap: 9 (ref 5–15)
BUN: 9 mg/dL (ref 6–20)
CO2: 23 mmol/L (ref 22–32)
Calcium: 8.4 mg/dL — ABNORMAL LOW (ref 8.9–10.3)
Chloride: 108 mmol/L (ref 98–111)
Creatinine, Ser: 0.78 mg/dL (ref 0.44–1.00)
GFR calc Af Amer: >60 mL/min (ref >60)
GFR calc non Af Amer: >60 mL/min (ref >60)
Glucose, Bld: 98 mg/dL (ref 70–99)
Potassium: 3.8 mmol/L (ref 3.5–5.1)
Sodium: 140 mmol/L (ref 135–145)
Total Bilirubin: 0.5 mg/dL (ref 0.3–1.2)
Total Protein: 5.6 g/dL — ABNORMAL LOW (ref 6.5–8.1)

## 2018-10-22 LAB — URINALYSIS, ROUTINE W REFLEX MICROSCOPIC
Bilirubin Urine: NEGATIVE
Glucose, UA: NEGATIVE mg/dL
Hgb urine dipstick: NEGATIVE
Ketones, ur: NEGATIVE mg/dL
Leukocytes,Ua: NEGATIVE
Nitrite: NEGATIVE
Protein, ur: NEGATIVE mg/dL
Specific Gravity, Urine: 1.04 — ABNORMAL HIGH (ref 1.005–1.030)
pH: 6 (ref 5.0–8.0)

## 2018-10-22 LAB — CBC WITH DIFFERENTIAL/PLATELET
Abs Immature Granulocytes: 0.04 10*3/uL (ref 0.00–0.07)
Basophils Absolute: 0 10*3/uL (ref 0.0–0.1)
Basophils Relative: 0 %
Eosinophils Absolute: 0.1 10*3/uL (ref 0.0–0.5)
Eosinophils Relative: 1 %
HCT: 33.4 % — ABNORMAL LOW (ref 36.0–46.0)
Hemoglobin: 11 g/dL — ABNORMAL LOW (ref 12.0–15.0)
Immature Granulocytes: 0 %
Lymphocytes Relative: 27 %
Lymphs Abs: 2.5 10*3/uL (ref 0.7–4.0)
MCH: 31 pg (ref 26.0–34.0)
MCHC: 32.9 g/dL (ref 30.0–36.0)
MCV: 94.1 fL (ref 80.0–100.0)
Monocytes Absolute: 0.7 10*3/uL (ref 0.1–1.0)
Monocytes Relative: 8 %
Neutro Abs: 5.9 10*3/uL (ref 1.7–7.7)
Neutrophils Relative %: 64 %
Platelets: 179 10*3/uL (ref 150–400)
RBC: 3.55 MIL/uL — ABNORMAL LOW (ref 3.87–5.11)
RDW: 12.3 % (ref 11.5–15.5)
WBC: 9.3 10*3/uL (ref 4.0–10.5)
nRBC: 0 % (ref 0.0–0.2)

## 2018-10-22 LAB — BRAIN NATRIURETIC PEPTIDE: B Natriuretic Peptide: 73.8 pg/mL (ref 0.0–100.0)

## 2018-10-22 LAB — VALPROIC ACID LEVEL: Valproic Acid Lvl: 49 ug/mL — ABNORMAL LOW (ref 50.0–100.0)

## 2018-10-22 LAB — STREP PNEUMONIAE URINARY ANTIGEN: Strep Pneumo Urinary Antigen: NEGATIVE

## 2018-10-22 LAB — URINE CULTURE: Culture: <10000 — AB

## 2018-10-22 LAB — HIV ANTIBODY (ROUTINE TESTING W REFLEX): HIV Screen 4th Generation wRfx: NONREACTIVE

## 2018-10-22 MED ORDER — METOPROLOL TARTRATE 25 MG PO TABS: 12.5000 mg | ORAL_TABLET | Freq: Every day | ORAL | 1 refills | Status: DC

## 2018-10-22 MED ORDER — AMOXICILLIN-POT CLAVULANATE 875-125 MG PO TABS: 1.0000 | ORAL_TABLET | Freq: Two times a day (BID) | ORAL | 0 refills | Status: AC

## 2018-10-22 MED ORDER — METOPROLOL TARTRATE 12.5 MG HALF TABLET: 12.5000 mg | ORAL_TABLET | Freq: Every day | ORAL | Status: DC

## 2018-10-22 NOTE — Discharge Summary (Signed)
Physician Discharge Summary  Dana Mcneil ZOX:[id number] DOB: [birth year] DOA: 10/21/2018  PCP: Jac Canavan, PA-C  Admit date: 10/21/2018 Discharge date: 10/22/2018  Admitted From:home Disposition: home Recommendations for Outpatient Follow-up:  1. Follow up with PCP in 1-2 weeks 2. Please obtain BMP/CBC in one week 3. Please follow up with cardiology   Home Health:pt Equipment/Devices:none Discharge Condition stable CODE STATUS full code Diet recommendation: Cardiac  Brief/Interim Summary:27 y.o. female with history of intellectual disability and recently found of increasing bilateral lower extremity edema followed by cardiologist 2D echo done in March 2020 was showing normal EF was brought to the ER after patient became more short of breath tachycardic with easily desaturating on ambulation over the last 3 days.  4 days ago patient also had an episode of nausea vomiting.  No fever chills and as per the patient's mother patient usually does not get febrile.  Due to persistent symptoms patient was brought to the ER.  ED Course: In the ER patient is found to be tachycardic with heart rate around 125 he has bilateral lower extremity edema.  Dopplers were negative for DVT.  EKG shows sinus tachycardia.  Patient does have history of sinus tachycardia but patient mother states is more than usual.  Blood work showed WBC count of 15 creatinine 1.03 increase from baseline LFTs are largely unremarkable.  Given the tachycardia and hypoxia patient underwent CT angiogram of the chest abdomen and pelvis.  Which shows bilateral pneumonia abdomen was unremarkable.  Patient was started on empiric antibiotics for community-acquired pneumonia and was given 1 L fluid bolus.  On exam patient does have bilateral lower extremity edema.  COVID-19 was negative.  Review of Systems: As per HPI, rest all negative.  Discharge Diagnoses:  Principal Problem:   CAP (community acquired pneumonia) Active Problems:    Generalized anxiety disorder   Intellectual disability  1. Community-acquired pneumonia -patient has been placed empirically on Rocephin and azithromycin.  Patient and mother feels that she is back to her baseline.  Saturation is about 95% on room air.  Antibiotics.  COVID-19 was negative.  CT of the chest abdomen and pelvis shows no evidence of pulmonary embolism mild patchy opacity in left upper lobe minimal patchy opacity in the right upper lobe compatible with pneumonia.  No acute abdominal or pelvic abnormality. 2. Sinus tachycardia appears to be chronic mother reports her blood pressure has also been elevated.  I will start her on a small dose of metoprolol 12.5 daily.  She will follow-up with cardiology.  TSH normal. 3. Intellectual disability and general anxiety disorder on multiple medications continue all home medications. 4. Acute renal failure patient received fluid boluses her renal functions returned back to baseline on the day of discharge her creatinine is 0.78.  5. Bilateral lower extremity edema appears to be chronic.  Patient does have low albumin 2.9.  Recent echo in March 2028 unremarkable.  DVT studies today was negative.   Estimated body mass index is 33.8 kg/m as calculated from the following:   Height as of this encounter: 4\' 11"  (1.499 m).   Weight as of this encounter: 75.9 kg.  Discharge Instructions  Discharge Instructions    Call MD for:  difficulty breathing, headache or visual disturbances   Complete by:  As directed    Call MD for:  persistant nausea and vomiting   Complete by:  As directed    Call MD for:  temperature >100.4   Complete by:  As directed  Diet - low sodium heart healthy   Complete by:  As directed    Increase activity slowly   Complete by:  As directed      Allergies as of 10/22/2018      Reactions   Haldol [haloperidol Lactate]    hyperactive   Lorazepam    Ativan- hyperactive      Medication List    STOP taking these  medications   furosemide 20 MG tablet Commonly known as:  Lasix   guaiFENesin 600 MG 12 hr tablet Commonly known as:  Mucinex   hydrochlorothiazide 25 MG tablet Commonly known as:  HYDRODIURIL   potassium chloride 10 MEQ tablet Commonly known as:  Klor-Con 10     TAKE these medications   amoxicillin-clavulanate 875-125 MG tablet Commonly known as:  Augmentin Take 1 tablet by mouth 2 (two) times daily for 5 days.   Asenapine Maleate 10 MG Subl Place 20 mg under the tongue at bedtime.   benztropine 0.5 MG tablet Commonly known as:  COGENTIN Take 0.5 mg by mouth 2 (two) times daily.   busPIRone 10 MG tablet Commonly known as:  BUSPAR Take 10 mg by mouth 3 (three) times daily.   cetirizine 10 MG tablet Commonly known as:  ZYRTEC Take 10 mg by mouth daily.   clonazePAM 1 MG tablet Commonly known as:  KLONOPIN Take 1 mg by mouth 3 (three) times daily.   divalproex 250 MG DR tablet Commonly known as:  DEPAKOTE Take 750 mg by mouth at bedtime.   doxepin 75 MG capsule Commonly known as:  SINEQUAN Take 75 mg by mouth 2 (two) times a day.   fluticasone 50 MCG/ACT nasal spray Commonly known as:  FLONASE Place 1 spray into both nostrils daily.   hydrOXYzine 50 MG capsule Commonly known as:  VISTARIL Take 50 mg by mouth 3 (three) times daily as needed for anxiety.   metoprolol tartrate 25 MG tablet Commonly known as:  LOPRESSOR Take 0.5 tablets (12.5 mg total) by mouth daily for 30 days.   omeprazole 40 MG capsule Commonly known as:  PRILOSEC Take 1 capsule (40 mg total) by mouth daily.   polyethylene glycol powder 17 GM/SCOOP powder Commonly known as:  GLYCOLAX/MIRALAX 1 cap full daily in a beverage in the morning What changed:    how much to take  how to take this  when to take this  reasons to take this  additional instructions   PRESCRIPTION MEDICATION MI Paste Plus. -- Apply pea size amount nightly with finger or Q tip after flossing and brushing  until gone   sertraline 100 MG tablet Commonly known as:  ZOLOFT Take 100 mg by mouth daily.   Vitamin D3 50 MCG (2000 UT) capsule Take 2,000 Units by mouth daily.      Follow-up Information    Tysinger, Kermit Balo, PA-C Follow up.   Specialty:  Family Medicine Contact information: [address] [place] [address] [phone]        Elder Negus, MD Follow up.   Specialties:  Cardiology, Radiology Contact information: [address] [place] [address] [phone]          Allergies  Allergen Reactions  . Haldol [Haloperidol Lactate]     hyperactive  . Lorazepam     Ativan- hyperactive    Consultations:  None   Procedures/Studies: Dg Abd 1 View  Result Date: 10/22/2018 CLINICAL DATA:  Abdominal distention. EXAM: ABDOMEN - 1 VIEW COMPARISON:  CT scan dated 10/21/2018 FINDINGS: The bowel gas pattern is normal. No radio-opaque calculi or other significant radiographic abnormality are seen. IMPRESSION: Negative. Electronically Signed   By: Francene Boyers M.D.   On: 10/22/2018 08:10   Ct Angio Chest Pe W/cm &/or Wo Cm  Result Date: 10/21/2018 CLINICAL DATA:  Shortness of breath and tachycardia since last night. EXAM: CT ANGIOGRAPHY CHEST CT ABDOMEN AND PELVIS WITH CONTRAST TECHNIQUE: Multidetector CT imaging of the chest was performed using the standard protocol during bolus administration of intravenous contrast. Multiplanar CT image reconstructions and MIPs were obtained to evaluate the vascular anatomy. Multidetector CT imaging of the abdomen and pelvis was performed using the standard protocol during bolus administration of intravenous contrast. CONTRAST:  OMNIPAQUE IOHEXOL 350 MG/ML SOLN COMPARISON:  Portable chest obtained earlier today. FINDINGS: CTA CHEST FINDINGS Cardiovascular: Satisfactory opacification of the pulmonary arteries to the segmental level. No evidence of pulmonary embolism. Normal heart size. No pericardial  effusion. Mediastinum/Nodes: Tortuous, gas-filled esophagus. No enlarged lymph nodes. The included portion of the thyroid gland appears normal. Lungs/Pleura: Mild patchy opacity in the left upper lobe and minimal patchy opacity in the right upper lobe. Otherwise, clear lungs. No pleural fluid. Musculoskeletal: Mild thoracic spine degenerative changes. Review of the MIP images confirms the above findings. CT ABDOMEN and PELVIS FINDINGS Hepatobiliary: No focal liver abnormality is seen. No gallstones, gallbladder wall thickening, or biliary dilatation. Pancreas: Unremarkable. No pancreatic ductal dilatation or surrounding inflammatory changes. Spleen: Normal in size without focal abnormality. Adrenals/Urinary Tract: Adrenal glands are unremarkable. Kidneys are normal, without renal calculi, focal lesion, or hydronephrosis. Bladder is unremarkable. Stomach/Bowel: Unremarkable stomach, small bowel and colon. No evidence of appendicitis. Vascular/Lymphatic: No significant vascular findings are present. No enlarged abdominal or pelvic lymph nodes. Reproductive: Small uterus. No adnexal mass. Other: Tiny umbilical hernia containing fat. Small amount of free peritoneal fluid in the pelvis, within normal limits of physiological fluid. Musculoskeletal: Small Schmorl's nodes in the lower thoracic spine. Review of the MIP images confirms the above findings. IMPRESSION: 1. No pulmonary emboli. 2. Mild patchy opacity in the left upper lobe and minimal patchy opacity in the right upper lobe, compatible with pneumonia. 3. No acute abdominal or pelvic abnormality. Electronically Signed   By: Beckie Salts M.D.   On: 10/21/2018 18:23   Ct Abdomen Pelvis W Contrast  Result Date: 10/21/2018 CLINICAL DATA:  Shortness of breath and tachycardia since last night. EXAM: CT ANGIOGRAPHY CHEST CT ABDOMEN AND PELVIS WITH CONTRAST TECHNIQUE: Multidetector CT imaging of the chest was performed using the standard protocol during bolus  administration of intravenous contrast. Multiplanar CT image reconstructions and MIPs were obtained to evaluate the vascular anatomy. Multidetector CT imaging of the abdomen and pelvis was performed using the standard protocol during bolus administration of intravenous contrast. CONTRAST:  OMNIPAQUE IOHEXOL 350 MG/ML SOLN COMPARISON:  Portable chest obtained earlier today. FINDINGS: CTA CHEST FINDINGS Cardiovascular: Satisfactory opacification of the pulmonary arteries to the segmental level. No evidence of pulmonary embolism. Normal heart size. No pericardial effusion. Mediastinum/Nodes: Tortuous, gas-filled esophagus. No enlarged lymph nodes. The included portion of the thyroid gland appears normal. Lungs/Pleura: Mild patchy opacity in the left upper lobe and minimal patchy opacity in the right upper lobe. Otherwise, clear lungs. No pleural fluid. Musculoskeletal: Mild thoracic spine degenerative changes. Review of the MIP images confirms the above findings. CT ABDOMEN and PELVIS FINDINGS Hepatobiliary: No focal liver abnormality is seen. No gallstones, gallbladder wall thickening, or biliary dilatation. Pancreas: Unremarkable. No  pancreatic ductal dilatation or surrounding inflammatory changes. Spleen: Normal in size without focal abnormality. Adrenals/Urinary Tract: Adrenal glands are unremarkable. Kidneys are normal, without renal calculi, focal lesion, or hydronephrosis. Bladder is unremarkable. Stomach/Bowel: Unremarkable stomach, small bowel and colon. No evidence of appendicitis. Vascular/Lymphatic: No significant vascular findings are present. No enlarged abdominal or pelvic lymph nodes. Reproductive: Small uterus. No adnexal mass. Other: Tiny umbilical hernia containing fat. Small amount of free peritoneal fluid in the pelvis, within normal limits of physiological fluid. Musculoskeletal: Small Schmorl's nodes in the lower thoracic spine. Review of the MIP images confirms the above findings.  IMPRESSION: 1. No pulmonary emboli. 2. Mild patchy opacity in the left upper lobe and minimal patchy opacity in the right upper lobe, compatible with pneumonia. 3. No acute abdominal or pelvic abnormality. Electronically Signed   By: Beckie Salts M.D.   On: 10/21/2018 18:23   Dg Chest Port 1 View  Result Date: 10/21/2018 CLINICAL DATA:  Shortness of breath EXAM: PORTABLE CHEST 1 VIEW COMPARISON:  Portable exam 1150 hours compared to 02/20/2018 FINDINGS: Normal heart size and pulmonary vascularity. Tubular gas structure within the mediastinum likely reflects an air-filled distended esophagus. Gaseous distention of the proximal stomach as well. Decreased lung volumes with mild RIGHT basilar atelectasis. Upper lungs clear. No pleural effusion or pneumothorax. IMPRESSION: Gaseous distention of the esophagus and proximal stomach. Low lung volumes with RIGHT basilar atelectasis. Electronically Signed   By: Ulyses Southward M.D.   On: 10/21/2018 12:36   Vas [place] Lower Extremity Venous (dvt) (only [facility])  Result Date: 10/21/2018  Lower Venous Study Indications: Swelling.  Performing Technologist: Chanda Busing RVT  Examination Guidelines: A complete evaluation includes B-mode imaging, spectral Doppler, color Doppler, and power Doppler as needed of all accessible portions of each vessel. Bilateral testing is considered an integral part of a complete examination. Limited examinations for reoccurring indications may be performed as noted.  +---------+---------------+---------+-----------+----------+-------+ RIGHT    CompressibilityPhasicitySpontaneityPropertiesSummary +---------+---------------+---------+-----------+----------+-------+ CFV      Full           Yes      Yes                          +---------+---------------+---------+-----------+----------+-------+ SFJ      Full                                                 +---------+---------------+---------+-----------+----------+-------+ FV Prox   Full                                                 +---------+---------------+---------+-----------+----------+-------+ FV Mid   Full                                                 +---------+---------------+---------+-----------+----------+-------+ FV DistalFull                                                 +---------+---------------+---------+-----------+----------+-------+  PFV      Full                                                 +---------+---------------+---------+-----------+----------+-------+ POP      Full           Yes      Yes                          +---------+---------------+---------+-----------+----------+-------+ PTV      Full                                                 +---------+---------------+---------+-----------+----------+-------+ PERO     Full                                                 +---------+---------------+---------+-----------+----------+-------+   +---------+---------------+---------+-----------+----------+-------+ LEFT     CompressibilityPhasicitySpontaneityPropertiesSummary +---------+---------------+---------+-----------+----------+-------+ CFV      Full           Yes      Yes                          +---------+---------------+---------+-----------+----------+-------+ SFJ      Full                                                 +---------+---------------+---------+-----------+----------+-------+ FV Prox  Full                                                 +---------+---------------+---------+-----------+----------+-------+ FV Mid   Full                                                 +---------+---------------+---------+-----------+----------+-------+ FV DistalFull                                                 +---------+---------------+---------+-----------+----------+-------+ PFV      Full                                                  +---------+---------------+---------+-----------+----------+-------+ POP      Full           Yes      Yes                          +---------+---------------+---------+-----------+----------+-------+ PTV  Full                                                 +---------+---------------+---------+-----------+----------+-------+ PERO     Full                                                 +---------+---------------+---------+-----------+----------+-------+     Summary: Right: There is no evidence of deep vein thrombosis in the lower extremity. No cystic structure found in the popliteal fossa. Left: There is no evidence of deep vein thrombosis in the lower extremity. No cystic structure found in the popliteal fossa.  *See table(s) above for measurements and observations. Electronically signed by Sherald Hess MD on 10/21/2018 at 5:31:53 PM.    Final     (Echo, Carotid, EGD, Colonoscopy, ERCP)    Subjective:  Resting in bed smiling mom denies any new complaints Discharge Exam: Vitals:   10/21/18 2235 10/22/18 0911  BP: 117/86 (!) 116/99  Pulse: (!) 108 99  Resp:  18  Temp: 98 F (36.7 C) 98.2 F (36.8 C)  SpO2: 99% 100%   Vitals:   10/21/18 2200 10/21/18 2235 10/22/18 0623 10/22/18 0911  BP:  117/86  (!) 116/99  Pulse:  (!) 108  99  Resp:    18  Temp:  98 F (36.7 C)  98.2 F (36.8 C)  TempSrc:  Oral  Oral  SpO2:  99%  100%  Weight:   75.9 kg   Height: 4\' 11"  (1.499 m)       General: Pt is alert, awake, not in acute distress Cardiovascular: RRR, S1/S2 +, no rubs, no gallops Respiratory: CTA bilaterally, no wheezing, no rhonchi Abdominal: Soft, NT, ND, bowel sounds + Extremities trace edema  no cyanosis    The results of significant diagnostics from this hospitalization (including imaging, microbiology, ancillary and laboratory) are listed below for reference.     Microbiology: Recent Results (from the past 240 hour(s))  SARS Coronavirus 2 (CEPHEID-  Performed in [facility] hospital lab), Hosp Order     Status: None   Collection Time: 10/21/18  3:01 PM  Result Value Ref Range Status   SARS Coronavirus 2 NEGATIVE NEGATIVE Final    Comment: (NOTE) If result is NEGATIVE SARS-CoV-2 target nucleic acids are NOT DETECTED. The SARS-CoV-2 RNA is generally detectable in upper and lower  respiratory specimens during the acute phase of infection. The lowest  concentration of SARS-CoV-2 viral copies this assay can detect is 250  copies / mL. A negative result does not preclude SARS-CoV-2 infection  and should not be used as the sole basis for treatment or other  patient management decisions.  A negative result may occur with  improper specimen collection / handling, submission of specimen other  than nasopharyngeal swab, presence of viral mutation(s) within the  areas targeted by this assay, and inadequate number of viral copies  (<250 copies / mL). A negative result must be combined with clinical  observations, patient history, and epidemiological information. If result is POSITIVE SARS-CoV-2 target nucleic acids are DETECTED. The SARS-CoV-2 RNA is generally detectable in upper and lower  respiratory specimens dur ing the acute phase of infection.  Positive  results are indicative of active  infection with SARS-CoV-2.  Clinical  correlation with patient history and other diagnostic information is  necessary to determine patient infection status.  Positive results do  not rule out bacterial infection or co-infection with other viruses. If result is PRESUMPTIVE POSTIVE SARS-CoV-2 nucleic acids MAY BE PRESENT.   A presumptive positive result was obtained on the submitted specimen  and confirmed on repeat testing.  While 2019 novel coronavirus  (SARS-CoV-2) nucleic acids may be present in the submitted sample  additional confirmatory testing may be necessary for epidemiological  and / or clinical management purposes  to differentiate between   SARS-CoV-2 and other Sarbecovirus currently known to infect humans.  If clinically indicated additional testing with an alternate test  methodology [phone]) is advised. The SARS-CoV-2 RNA is generally  detectable in upper and lower respiratory sp ecimens during the acute  phase of infection. The expected result is Negative. Fact Sheet for Patients:  BoilerBrush.com.cy Fact Sheet for Healthcare Providers: https://pope.com/ This test is not yet approved or cleared by the [place] FDA and has been authorized for detection and/or diagnosis of SARS-CoV-2 by FDA under an Emergency Use Authorization (EUA).  This EUA will remain in effect (meaning this test can be used) for the duration of the COVID-19 declaration under Section 564(b)(1) of the Act, 21 U.S.C. section 360bbb-3(b)(1), unless the authorization is terminated or revoked sooner. Performed at [facility], [address], [place], [place] [address]   Blood culture (routine x 2)     Status: None (Preliminary result)   Collection Time: 10/21/18  3:55 PM  Result Value Ref Range Status   Specimen Description BLOOD RIGHT HAND  Final   Special Requests   Final    BOTTLES DRAWN AEROBIC AND ANAEROBIC Blood Culture results may not be optimal due to an inadequate volume of blood received in culture bottles   Culture   Final    NO GROWTH < 24 HOURS Performed at [facility], [address] [place], [place] [address]    Report Status PENDING  Incomplete  Blood culture (routine x 2)     Status: None (Preliminary result)   Collection Time: 10/21/18  4:00 PM  Result Value Ref Range Status   Specimen Description BLOOD LEFT HAND  Final   Special Requests   Final    BOTTLES DRAWN AEROBIC AND ANAEROBIC Blood Culture results may not be optimal due to an inadequate volume of blood received in culture bottles   Culture   Final    NO GROWTH < 24 HOURS Performed at [facility], [address] [place], [place] [address]    Report Status PENDING  Incomplete     Labs: BNP (last 3 results) Recent Labs    07/20/18 1040 10/22/18 0455  BNP 57.3 73.8   Basic Metabolic Panel: Recent Labs  Lab 10/21/18 1126 10/22/18 0448  NA 139 140  K 3.8 3.8  CL 107 108  CO2 21* 23  GLUCOSE 118* 98  BUN 11 9  CREATININE 1.03* 0.78  CALCIUM 8.8* 8.4*   Liver Function Tests: Recent Labs  Lab 10/21/18 1600 10/22/18 0448  AST 15 11*  ALT 14 13  ALKPHOS 53 52  BILITOT 0.5 0.5  PROT 5.8* 5.6*  ALBUMIN 3.0* 2.9*   No results for input(s): LIPASE, AMYLASE in the last 168 hours. No results for input(s): AMMONIA in the last 168 hours. CBC: Recent Labs  Lab 10/21/18 1126 10/22/18 0448  WBC 15.0* 9.3  NEUTROABS 12.2* 5.9  HGB 12.1 11.0*  HCT 37.4 33.4*  MCV 94.9 94.1  PLT 214 179   Cardiac Enzymes: No results for input(s): CKTOTAL, CKMB, CKMBINDEX, TROPONINI in the last 168 hours. BNP: Invalid input(s): POCBNP CBG: No results for input(s): GLUCAP in the last 168 hours. D-Dimer Recent Labs    10/21/18 1306  DDIMER 0.39   Hgb A1c No results for input(s): HGBA1C in the last 72 hours. Lipid Profile No results for input(s): CHOL, HDL, LDLCALC, TRIG, CHOLHDL, LDLDIRECT in the last 72 hours. Thyroid function studies Recent Labs    10/22/18 0448  TSH 0.877   Anemia work up No results for input(s): VITAMINB12, FOLATE, FERRITIN, TIBC, IRON, RETICCTPCT in the last 72 hours. Urinalysis    Component Value Date/Time   COLORURINE YELLOW 10/22/2018 0448   APPEARANCEUR CLEAR 10/22/2018 0448   LABSPEC 1.040 (H) 10/22/2018 0448   LABSPEC 1.020 07/20/2018 1100   PHURINE 6.0 10/22/2018 0448   GLUCOSEU NEGATIVE 10/22/2018 0448   HGBUR NEGATIVE 10/22/2018 0448   BILIRUBINUR NEGATIVE 10/22/2018 0448   BILIRUBINUR negative 07/20/2018 1100   KETONESUR NEGATIVE 10/22/2018 0448   PROTEINUR NEGATIVE 10/22/2018 0448   UROBILINOGEN 1.0 02/03/2014 1619    NITRITE NEGATIVE 10/22/2018 0448   LEUKOCYTESUR NEGATIVE 10/22/2018 0448   Sepsis Labs Invalid input(s): PROCALCITONIN,  WBC,  LACTICIDVEN Microbiology Recent Results (from the past 240 hour(s))  SARS Coronavirus 2 (CEPHEID- Performed in [facility] hospital lab), Hosp Order     Status: None   Collection Time: 10/21/18  3:01 PM  Result Value Ref Range Status   SARS Coronavirus 2 NEGATIVE NEGATIVE Final    Comment: (NOTE) If result is NEGATIVE SARS-CoV-2 target nucleic acids are NOT DETECTED. The SARS-CoV-2 RNA is generally detectable in upper and lower  respiratory specimens during the acute phase of infection. The lowest  concentration of SARS-CoV-2 viral copies this assay can detect is 250  copies / mL. A negative result does not preclude SARS-CoV-2 infection  and should not be used as the sole basis for treatment or other  patient management decisions.  A negative result may occur with  improper specimen collection / handling, submission of specimen other  than nasopharyngeal swab, presence of viral mutation(s) within the  areas targeted by this assay, and inadequate number of viral copies  (<250 copies / mL). A negative result must be combined with clinical  observations, patient history, and epidemiological information. If result is POSITIVE SARS-CoV-2 target nucleic acids are DETECTED. The SARS-CoV-2 RNA is generally detectable in upper and lower  respiratory specimens dur ing the acute phase of infection.  Positive  results are indicative of active infection with SARS-CoV-2.  Clinical  correlation with patient history and other diagnostic information is  necessary to determine patient infection status.  Positive results do  not rule out bacterial infection or co-infection with other viruses. If result is PRESUMPTIVE POSTIVE SARS-CoV-2 nucleic acids MAY BE PRESENT.   A presumptive positive result was obtained on the submitted specimen  and confirmed on repeat testing.   While 2019 novel coronavirus  (SARS-CoV-2) nucleic acids may be present in the submitted sample  additional confirmatory testing may be necessary for epidemiological  and / or clinical management purposes  to differentiate between  SARS-CoV-2 and other Sarbecovirus currently known to infect humans.  If clinically indicated additional testing with an alternate test  methodology [phone]) is advised. The SARS-CoV-2 RNA is generally  detectable in upper and lower respiratory sp ecimens during the acute  phase of infection. The expected result is Negative. Fact Sheet for Patients:  BoilerBrush.com.cyhttps://www.fda.gov/media/136312/download Fact Sheet for Healthcare Providers: https://pope.com/https://www.fda.gov/media/136313/download This test is not yet approved or cleared by the [place] FDA and has been authorized for detection and/or diagnosis of SARS-CoV-2 by FDA under an Emergency Use Authorization (EUA).  This EUA will remain in effect (meaning this test can be used) for the duration of the COVID-19 declaration under Section 564(b)(1) of the Act, 21 U.S.C. section 360bbb-3(b)(1), unless the authorization is terminated or revoked sooner. Performed at [facility], [address] [place], [place] [address]   Blood culture (routine x 2)     Status: None (Preliminary result)   Collection Time: 10/21/18  3:55 PM  Result Value Ref Range Status   Specimen Description BLOOD RIGHT HAND  Final   Special Requests   Final    BOTTLES DRAWN AEROBIC AND ANAEROBIC Blood Culture results may not be optimal due to an inadequate volume of blood received in culture bottles   Culture   Final    NO GROWTH < 24 HOURS Performed at [facility], [address] [place], [place] [address]    Report Status PENDING  Incomplete  Blood culture (routine x 2)     Status: None (Preliminary result)   Collection Time: 10/21/18  4:00 PM  Result Value Ref Range Status   Specimen Description BLOOD LEFT HAND  Final   Special  Requests   Final    BOTTLES DRAWN AEROBIC AND ANAEROBIC Blood Culture results may not be optimal due to an inadequate volume of blood received in culture bottles   Culture   Final    NO GROWTH < 24 HOURS Performed at [facility], [address] [place], [place] [address]    Report Status PENDING  Incomplete     Time coordinating discharge: 33 minutes  SIGNED:   Alwyn RenElizabeth G , MD  [facility] 10/22/2018, 10:23 AM Pager   If 7PM-7AM, please contact night-coverage [email or web address] Password [email or web address]

## 2018-10-22 NOTE — Progress Notes (Signed)
Patient discharged home in care of mother, Dana Mcneil. Copy of AVS summary given with d/c education. No questions or concerns at this time.

## 2018-10-22 NOTE — TOC Transition Note (Signed)
Transition of Care [facility]) - CM/SW Discharge Note   Patient Details  Name: Dana Mcneil MRN: [id number] Date of Birth: 05/02/1991  [facility]) CM/SW Contact:  Leone Haven, RN Phone Number: 10/22/2018, 11:12 AM   Clinical Narrative:    From home with parents, she has psych sitter and pcs services.  The pcs services will also be providing Occupational services.  NCM offered choice for HHPT, Mom chose [place]. Referral made to [facility] for HHPT.  He will call NCM back to let know if they can take referral. Per Kandee Keen he can not take referral.  NCM informed Mom , she said to try [facility], referral made to [facility] , she took referral, also noted that mom said they need a Caucasian person to work with patient. Informed Alvino Chapel of this information.   Final next level of care: Home w Home Health Services Barriers to Discharge: No Barriers Identified   Patient Goals and CMS Choice Patient states their goals for this hospitalization and ongoing recovery are:: get better CMS Medicare.gov Compare Post Acute Care list provided to:: Patient Represenative (must comment) Choice offered to / list presented to : [facility] POA / Guardian  Discharge Placement                       Discharge Plan and Services In-house Referral: (psych sitter, and Psychologist, occupational) Discharge Planning Services: CM Consult Post Acute Care Choice: Home Health          DME Arranged: N/A DME Agency: NA       HH Arranged: PT HH Agency: [facility] Date HH Agency Contacted: 10/22/18 Time HH Agency Contacted: 1112 Representative spoke with at [facility] Agency: Alvino Chapel  Social Determinants of Health (SDOH) Interventions     Readmission Risk Interventions No flowsheet data found.

## 2018-10-22 NOTE — TOC Transition Note (Addendum)
Transition of Care [facility]) - CM/SW Discharge Note   Patient Details  Name: Dana Mcneil MRN: [id number] Date of Birth: [birth year]  Transition of Care [facility]) CM/SW Contact:  Leone Haven, RN Phone Number: 10/22/2018, 10:54 AM   Clinical Narrative:    From home with parents, she has psych sitter and pcs services.  The pcs services will also be providing Occupational services.  NCM offered choice for HHPT, Mom chose [place]. Referral made to [facility] for HHPT.  He will call NCM back to let know if they can take referral. Per Kandee Keen he can not take referral.  NCM informed Mom , she said to try [facility], referral made to [facility] , she took referral, also noted that mom said they need a Caucasian person to work with patient. Informed Alvino Chapel of this information.   Final next level of care: Home w Home Health Services Barriers to Discharge: No Barriers Identified   Patient Goals and CMS Choice Patient states their goals for this hospitalization and ongoing recovery are:: get better CMS Medicare.gov Compare Post Acute Care list provided to:: Patient Represenative (must comment) Choice offered to / list presented to : [facility] POA / Guardian  Discharge Placement                       Discharge Plan and Services In-house Referral: Scientist, physiological, and Psychologist, occupational) Discharge Planning Services: CM Consult Post Acute Care Choice: Home Health          DME Arranged: N/A DME Agency: NA       HH Arranged: PT HH Agency: [facility] Date [facility] Agency Contacted: 10/22/18 Time HH Agency Contacted: 1053 Representative spoke with at [facility] Agency: Kandee Keen  Social Determinants of Health (SDOH) Interventions     Readmission Risk Interventions No flowsheet data found.

## 2018-10-23 LAB — LEGIONELLA PNEUMOPHILA SEROGP 1 UR AG: L. pneumophila Serogp 1 Ur Ag: NEGATIVE

## 2018-10-25 ENCOUNTER — Other Ambulatory Visit: Payer: Self-pay | Admitting: Medical

## 2018-10-26 LAB — CULTURE, BLOOD (ROUTINE X 2)
Culture: NO GROWTH
Culture: NO GROWTH

## 2018-11-01 ENCOUNTER — Inpatient Hospital Stay: Payer: Self-pay | Admitting: Medical

## 2018-11-08 ENCOUNTER — Inpatient Hospital Stay: Payer: Self-pay | Admitting: Medical

## 2018-11-13 ENCOUNTER — Other Ambulatory Visit: Payer: Self-pay

## 2018-11-13 ENCOUNTER — Encounter (HOSPITAL_COMMUNITY): Payer: Self-pay | Admitting: Internal Medicine

## 2018-11-13 ENCOUNTER — Observation Stay (HOSPITAL_COMMUNITY): Payer: Medicaid Other

## 2018-11-13 ENCOUNTER — Inpatient Hospital Stay (HOSPITAL_COMMUNITY)
Admission: EM | Admit: 2018-11-13 | Discharge: 2018-11-16 | DRG: 101 | Disposition: A | Discharge status: Home / Self Care | Payer: Medicaid Other | Attending: Internal Medicine | Admitting: Internal Medicine

## 2018-11-13 DIAGNOSIS — K219 Gastro-esophageal reflux disease without esophagitis: Secondary | ICD-10-CM | POA: Diagnosis not present

## 2018-11-13 DIAGNOSIS — F4310 Post-traumatic stress disorder, unspecified: Secondary | ICD-10-CM | POA: Diagnosis present

## 2018-11-13 DIAGNOSIS — Z79899 Other long term (current) drug therapy: Secondary | ICD-10-CM

## 2018-11-13 DIAGNOSIS — E872 Acidosis: Secondary | ICD-10-CM | POA: Diagnosis present

## 2018-11-13 DIAGNOSIS — R454 Irritability and anger: Secondary | ICD-10-CM | POA: Diagnosis present

## 2018-11-13 DIAGNOSIS — Z6841 Body Mass Index (BMI) 40.0 and over, adult: Secondary | ICD-10-CM

## 2018-11-13 DIAGNOSIS — X58XXXA Exposure to other specified factors, initial encounter: Secondary | ICD-10-CM | POA: Diagnosis present

## 2018-11-13 DIAGNOSIS — R4689 Other symptoms and signs involving appearance and behavior: Secondary | ICD-10-CM | POA: Diagnosis present

## 2018-11-13 DIAGNOSIS — Z888 Allergy status to other drugs, medicaments and biological substances status: Secondary | ICD-10-CM

## 2018-11-13 DIAGNOSIS — F79 Unspecified intellectual disabilities: Secondary | ICD-10-CM | POA: Diagnosis present

## 2018-11-13 DIAGNOSIS — Z8249 Family history of ischemic heart disease and other diseases of the circulatory system: Secondary | ICD-10-CM

## 2018-11-13 DIAGNOSIS — Q8719 Other congenital malformation syndromes predominantly associated with short stature: Secondary | ICD-10-CM

## 2018-11-13 DIAGNOSIS — S01551A Open bite of lip, initial encounter: Secondary | ICD-10-CM | POA: Diagnosis present

## 2018-11-13 DIAGNOSIS — Z833 Family history of diabetes mellitus: Secondary | ICD-10-CM

## 2018-11-13 DIAGNOSIS — Z7951 Long term (current) use of inhaled steroids: Secondary | ICD-10-CM

## 2018-11-13 DIAGNOSIS — Q899 Congenital malformation, unspecified: Secondary | ICD-10-CM

## 2018-11-13 DIAGNOSIS — Q02 Microcephaly: Secondary | ICD-10-CM

## 2018-11-13 DIAGNOSIS — F411 Generalized anxiety disorder: Secondary | ICD-10-CM | POA: Diagnosis present

## 2018-11-13 DIAGNOSIS — Z1159 Encounter for screening for other viral diseases: Secondary | ICD-10-CM

## 2018-11-13 DIAGNOSIS — R569 Unspecified convulsions: Principal | ICD-10-CM

## 2018-11-13 DIAGNOSIS — E669 Obesity, unspecified: Secondary | ICD-10-CM | POA: Diagnosis present

## 2018-11-13 DIAGNOSIS — R6250 Unspecified lack of expected normal physiological development in childhood: Secondary | ICD-10-CM | POA: Diagnosis present

## 2018-11-13 DIAGNOSIS — F319 Bipolar disorder, unspecified: Secondary | ICD-10-CM | POA: Diagnosis present

## 2018-11-13 DIAGNOSIS — G9349 Other encephalopathy: Secondary | ICD-10-CM | POA: Diagnosis present

## 2018-11-13 LAB — CBC WITH DIFFERENTIAL/PLATELET
Abs Immature Granulocytes: 0.05 10*3/uL (ref 0.00–0.07)
Basophils Absolute: 0 10*3/uL (ref 0.0–0.1)
Basophils Relative: 0 %
Eosinophils Absolute: 0.1 10*3/uL (ref 0.0–0.5)
Eosinophils Relative: 1 %
HCT: 39.3 % (ref 36.0–46.0)
Hemoglobin: 12.1 g/dL (ref 12.0–15.0)
Immature Granulocytes: 1 %
Lymphocytes Relative: 31 %
Lymphs Abs: 2.5 10*3/uL (ref 0.7–4.0)
MCH: 30.5 pg (ref 26.0–34.0)
MCHC: 30.8 g/dL (ref 30.0–36.0)
MCV: 99 fL (ref 80.0–100.0)
Monocytes Absolute: 0.8 10*3/uL (ref 0.1–1.0)
Monocytes Relative: 10 %
Neutro Abs: 4.5 10*3/uL (ref 1.7–7.7)
Neutrophils Relative %: 57 %
Platelets: 113 10*3/uL — ABNORMAL LOW (ref 150–400)
RBC: 3.97 MIL/uL (ref 3.87–5.11)
RDW: 12.3 % (ref 11.5–15.5)
WBC: 7.8 10*3/uL (ref 4.0–10.5)
nRBC: 0 % (ref 0.0–0.2)

## 2018-11-13 LAB — COMPREHENSIVE METABOLIC PANEL
ALT: 21 U/L (ref 0–44)
AST: 21 U/L (ref 15–41)
Albumin: 4.5 g/dL (ref 3.5–5.0)
Alkaline Phosphatase: 70 U/L (ref 38–126)
Anion gap: 16 — ABNORMAL HIGH (ref 5–15)
BUN: 17 mg/dL (ref 6–20)
CO2: 14 mmol/L — ABNORMAL LOW (ref 22–32)
Calcium: 8.8 mg/dL — ABNORMAL LOW (ref 8.9–10.3)
Chloride: 110 mmol/L (ref 98–111)
Creatinine, Ser: 0.95 mg/dL (ref 0.44–1.00)
GFR calc Af Amer: >60 mL/min (ref >60)
GFR calc non Af Amer: >60 mL/min (ref >60)
Glucose, Bld: 106 mg/dL — ABNORMAL HIGH (ref 70–99)
Potassium: 3.4 mmol/L — ABNORMAL LOW (ref 3.5–5.1)
Sodium: 140 mmol/L (ref 135–145)
Total Bilirubin: 0.4 mg/dL (ref 0.3–1.2)
Total Protein: 7.9 g/dL (ref 6.5–8.1)

## 2018-11-13 MED ORDER — SODIUM CHLORIDE 0.9 % IV SOLN
250.0000 mL | INTRAVENOUS | Status: DC | PRN
  Administered 2018-11-14: 04:00:00 via INTRAVENOUS

## 2018-11-13 MED ORDER — VALPROATE SODIUM 500 MG/5ML IV SOLN
250.0000 mg | Freq: Once | INTRAVENOUS | Status: AC
  Administered 2018-11-13: 250 mg via INTRAVENOUS
  Filled 2018-11-13: qty 2.5

## 2018-11-13 MED ORDER — MIDAZOLAM HCL 2 MG/2ML IJ SOLN
2.0000 mg | Freq: Once | INTRAMUSCULAR | Status: AC
  Administered 2018-11-13: 2 mg via INTRAVENOUS
  Filled 2018-11-13: qty 2

## 2018-11-13 MED ORDER — SODIUM CHLORIDE 0.9% FLUSH: 3.0000 mL | INTRAVENOUS | Status: DC | PRN

## 2018-11-13 MED ORDER — ENOXAPARIN SODIUM 40 MG/0.4ML ~~LOC~~ SOLN
40.0000 mg | Freq: Every day | SUBCUTANEOUS | Status: DC
  Administered 2018-11-14 – 2018-11-16 (×3): 40 mg via SUBCUTANEOUS
  Filled 2018-11-13 (×3): qty 0.4

## 2018-11-13 MED ORDER — DIAZEPAM 2.5 MG RE GEL
5.0000 mg | Freq: Once | RECTAL | Status: AC
  Administered 2018-11-13: 5 mg via RECTAL
  Filled 2018-11-13: qty 5

## 2018-11-13 MED ORDER — ACETAMINOPHEN 650 MG RE SUPP: 650.0000 mg | Freq: Four times a day (QID) | RECTAL | Status: DC | PRN

## 2018-11-13 MED ORDER — SODIUM CHLORIDE 0.9% FLUSH
3.0000 mL | Freq: Two times a day (BID) | INTRAVENOUS | Status: DC
  Administered 2018-11-14 – 2018-11-16 (×5): 3 mL via INTRAVENOUS

## 2018-11-13 MED ORDER — ACETAMINOPHEN 325 MG PO TABS
650.0000 mg | ORAL_TABLET | Freq: Four times a day (QID) | ORAL | Status: DC | PRN
  Administered 2018-11-14 (×2): 650 mg via ORAL
  Filled 2018-11-13 (×2): qty 2

## 2018-11-13 NOTE — H&P (Signed)
TRH H&P    Patient Demographics:    Dana Mcneil Deming, is a 28 y.o. female  MRN: [id number]  DOB - 1991-02-23  Admit Date - 11/13/2018  Referring MD/NP/PA:  Gerhard Munchobert Lockwood  Outpatient Primary MD for the patient is Tysinger, Kermit Baloavid S, PA-C  Patient coming from: home  Chief complaint- seizure   HPI:    Dana Mcneil Nobile  is a 28 y.o. 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  Starting this afternoon.  the first seizure lasted about 45 seconds.  The second seizure occurred while she was going to get something from the refrigerator and lasted minutes.  Pt had a 3rd seizure en route to ER, and then a 4th brief seizure while in ED.  Pt bit her lip, and wet her diaper.    Pt's family notes that she her clonazepam and depakote were stopped a week ago, and this may have been a precipitating factor.  No recent head trauma.  No prior history of seizure disorder.   In ED,  T 98.1, P 97  R 26, Bp 126/93  Pox 100% Wt 75.8kg  Wbc 7.8, Hgb 12.1, Plt 113 Na 140, K 3.4 Bun 17, Creatinine 0.95 Glucose 106 Hco3 14 AG 16  Per ED, neurology said that the timing of the seizure is classic for benzo/ depakote withdrawal and to start clonazepam at 0.5mg  po tid  Pt will be admitted for seizure.        Review of systems:    In addition to the HPI above,  No Fever-chills, No Headache, No changes with Vision or hearing, No problems swallowing food or Liquids, No Chest pain, Cough or Shortness of Breath, No Abdominal pain, No Nausea or Vomiting, bowel movements are regular, No Blood in stool or Urine, No dysuria, No new skin rashes or bruises, No new joints pains-aches,  No new weakness, tingling, numbness in any extremity, No recent weight gain or loss, No polyuria, polydypsia or polyphagia, No significant  Mental Stressors.  All other systems reviewed and are negative.    Past History of the following :    Past Medical History:  Diagnosis Date  . [name]   . MR (mental retardation)       History reviewed. No pertinent surgical history.    Social History:      Social History   Tobacco Use  . Smoking status: Never Smoker  . Smokeless tobacco: Never Used  Substance Use Topics  . Alcohol use: No    Alcohol/week: 0.0 standard drinks       Family History :     Family History  Problem Relation Age of Onset  . Diabetes Father   . Cancer Maternal Grandfather   . Hyperlipidemia Other   . Hypertension Other   . Colon cancer Neg Hx   . Colon polyps Neg Hx   . Kidney disease Neg Hx   . Esophageal cancer Neg Hx  Home Medications:   Prior to Admission medications   Medication Sig Start Date End Date Taking? Authorizing Provider  Asenapine Maleate 10 MG SUBL Place 20 mg under the tongue at bedtime.     [provider]  benztropine (COGENTIN) 0.5 MG tablet Take 0.5 mg by mouth 2 (two) times daily.    [provider]  busPIRone (BUSPAR) 10 MG tablet Take 10 mg by mouth 3 (three) times daily.    [provider]  cetirizine (ZYRTEC) 10 MG tablet Take 10 mg by mouth daily.    [provider]  Cholecalciferol (VITAMIN D3) 2000 units capsule Take 2,000 Units by mouth daily.    [provider]  clonazePAM (KLONOPIN) 1 MG tablet Take 1 mg by mouth 3 (three) times daily.    [provider]  divalproex (DEPAKOTE) 250 MG DR tablet Take 750 mg by mouth at bedtime.     [provider]  doxepin (SINEQUAN) 75 MG capsule Take 75 mg by mouth 2 (two) times a day.     [provider]  fluticasone (FLONASE) 50 MCG/ACT nasal spray Place 1 spray into both nostrils daily. 02/20/18   Petrucelli, Samantha R, PA-C  hydrOXYzine (VISTARIL) 50 MG capsule Take 50 mg by mouth 3 (three) times daily as needed for  anxiety.    [provider]  metoprolol tartrate (LOPRESSOR) 25 MG tablet Take 0.5 tablets (12.5 mg total) by mouth daily for 30 days. 10/22/18 11/21/18  Georgette Shell, MD  omeprazole (PRILOSEC) 40 MG capsule TAKE (1) CAPSULE DAILY. 10/26/18   Tysinger, Camelia Eng, PA-C  polyethylene glycol powder (GLYCOLAX/MIRALAX) powder 1 cap full daily in a beverage in the morning Patient taking differently: Take 17 g by mouth daily as needed. 1 cap full daily in a beverage in the morning as needed 06/18/18   Tysinger, Camelia Eng, PA-C  PRESCRIPTION MEDICATION MI Paste Plus. -- Apply pea size amount nightly with finger or Q tip after flossing and brushing until gone    [provider]  sertraline (ZOLOFT) 100 MG tablet Take 100 mg by mouth daily.     [provider]     Allergies:     Allergies  Allergen Reactions  . Haldol [Haloperidol Lactate]     hyperactive  . Lorazepam     Ativan- hyperactive     Physical Exam:   Vitals  Blood pressure (!) 140/96, pulse 71, temperature 98.1 F (36.7 C), temperature source Oral, resp. rate 20, height 4\' 6"  (1.372 m), weight 75.8 kg, SpO2 100 %.  1.  General: aoxo1  2. Psychiatric: euthymic  3. Neurologic: cn2-12 intact, reflexes 2+ symmetric, diffuse with no clonus, motor 5/5 in all 4 ext  4. HEENMT:  Anicteric, pupils 1.45mm symmetric, direct, consensual , intact Neck: no jvd  5. Respiratory : CTAB  6. Cardiovascular : rrr s1, s2,   7. Gastrointestinal:  Abd: soft, nt, nd, +bs  8. Skin:  Ext: no c/c/e,  No rash  9.Musculoskeletal:  Good ROM  No adenoapathy    Data Review:    CBC Recent Labs  Lab 11/13/18 2024  WBC 7.8  HGB 12.1  HCT 39.3  PLT 113*  MCV 99.0  MCH 30.5  MCHC 30.8  RDW 12.3  LYMPHSABS 2.5  MONOABS 0.8  EOSABS 0.1  BASOSABS 0.0   ------------------------------------------------------------------------------------------------------------------  Results for orders placed or  performed during the hospital encounter of 11/13/18 (from the past 48 hour(s))  Comprehensive metabolic panel  Status: Abnormal   Collection Time: 11/13/18  8:24 PM  Result Value Ref Range   Sodium 140 135 - 145 mmol/L   Potassium 3.4 (L) 3.5 - 5.1 mmol/L   Chloride 110 98 - 111 mmol/L   CO2 14 (L) 22 - 32 mmol/L   Glucose, Bld 106 (H) 70 - 99 mg/dL   BUN 17 6 - 20 mg/dL   Creatinine, Ser 6.210.95 0.44 - 1.00 mg/dL   Calcium 8.8 (L) 8.9 - 10.3 mg/dL   Total Protein 7.9 6.5 - 8.1 g/dL   Albumin 4.5 3.5 - 5.0 g/dL   AST 21 15 - 41 U/L   ALT 21 0 - 44 U/L   Alkaline Phosphatase 70 38 - 126 U/L   Total Bilirubin 0.4 0.3 - 1.2 mg/dL   GFR calc non Af Amer >60 >60 mL/min   GFR calc Af Amer >60 >60 mL/min   Anion gap 16 (H) 5 - 15    Comment: Performed at [facility], [address] [place], [place] [address]  CBC with Differential     Status: Abnormal   Collection Time: 11/13/18  8:24 PM  Result Value Ref Range   WBC 7.8 4.0 - 10.5 K/uL   RBC 3.97 3.87 - 5.11 MIL/uL   Hemoglobin 12.1 12.0 - 15.0 g/dL   HCT 78.439.3 69.636.0 - 29.546.0 %   MCV 99.0 80.0 - 100.0 fL   MCH 30.5 26.0 - 34.0 pg   MCHC 30.8 30.0 - 36.0 g/dL   RDW 28.412.3 13.211.5 - 44.015.5 %   Platelets 113 (L) 150 - 400 K/uL    Comment: PLATELET COUNT CONFIRMED BY SMEAR SPECIMEN CHECKED FOR CLOTS Immature Platelet Fraction may be clinically indicated, consider ordering this additional test [id number]    nRBC 0.0 0.0 - 0.2 %   Neutrophils Relative % 57 %   Neutro Abs 4.5 1.7 - 7.7 K/uL   Lymphocytes Relative 31 %   Lymphs Abs 2.5 0.7 - 4.0 K/uL   Monocytes Relative 10 %   Monocytes Absolute 0.8 0.1 - 1.0 K/uL   Eosinophils Relative 1 %   Eosinophils Absolute 0.1 0.0 - 0.5 K/uL   Basophils Relative 0 %   Basophils Absolute 0.0 0.0 - 0.1 K/uL   WBC Morphology MILD LEFT SHIFT (1-5% METAS, OCC MYELO, OCC BANDS)     Comment: TOXIC GRANULATION   Immature Granulocytes 1 %   Abs Immature Granulocytes 0.05 0.00 - 0.07  K/uL    Comment: Performed at [facility], [address] [place], [place] [address]    Chemistries  Recent Labs  Lab 11/13/18 2024  NA 140  K 3.4*  CL 110  CO2 14*  GLUCOSE 106*  BUN 17  CREATININE 0.95  CALCIUM 8.8*  AST 21  ALT 21  ALKPHOS 70  BILITOT 0.4   ------------------------------------------------------------------------------------------------------------------  ------------------------------------------------------------------------------------------------------------------ GFR: Estimated Creatinine Clearance: 68 mL/min (by C-G formula based on SCr of 0.95 mg/dL). Liver Function Tests: Recent Labs  Lab 11/13/18 2024  AST 21  ALT 21  ALKPHOS 70  BILITOT 0.4  PROT 7.9  ALBUMIN 4.5   No results for input(s): LIPASE, AMYLASE in the last 168 hours. No results for input(s): AMMONIA in the last 168 hours. Coagulation Profile: No results for input(s): INR, PROTIME in the last 168 hours. Cardiac Enzymes: No results for input(s): CKTOTAL, CKMB, CKMBINDEX, TROPONINI in the last 168 hours. BNP (last 3 results) No results for input(s): PROBNP in the last 8760  hours. HbA1C: No results for input(s): HGBA1C in the last 72 hours. CBG: No results for input(s): GLUCAP in the last 168 hours. Lipid Profile: No results for input(s): CHOL, HDL, LDLCALC, TRIG, CHOLHDL, LDLDIRECT in the last 72 hours. Thyroid Function Tests: No results for input(s): TSH, T4TOTAL, FREET4, T3FREE, THYROIDAB in the last 72 hours. Anemia Panel: No results for input(s): VITAMINB12, FOLATE, FERRITIN, TIBC, IRON, RETICCTPCT in the last 72 hours.  --------------------------------------------------------------------------------------------------------------- Urine analysis:    Component Value Date/Time   COLORURINE YELLOW 10/22/2018 0448   APPEARANCEUR CLEAR 10/22/2018 0448   LABSPEC 1.040 (H) 10/22/2018 0448   LABSPEC 1.020 07/20/2018 1100   PHURINE 6.0 10/22/2018 0448    GLUCOSEU NEGATIVE 10/22/2018 0448   HGBUR NEGATIVE 10/22/2018 0448   BILIRUBINUR NEGATIVE 10/22/2018 0448   BILIRUBINUR negative 07/20/2018 1100   KETONESUR NEGATIVE 10/22/2018 0448   PROTEINUR NEGATIVE 10/22/2018 0448   UROBILINOGEN 1.0 02/03/2014 1619   NITRITE NEGATIVE 10/22/2018 0448   LEUKOCYTESUR NEGATIVE 10/22/2018 0448      Imaging Results:    No results found.    Assessment & Plan:    Principal Problem:   Seizure (HCC) Active Problems:   Congenital abnormalities   [name]  Seizure CT brain Check CXR Restart clonazepam at 0.5mg  po tid And taper to off Consider formal consult to neurology in AM  Anxiety Cont zoloft 100mg  po qday Cont Geodon 60mg  po qday  H/o tachycardia Cont Metoprolol 12.5mg  po qday  Gerd Cont PPI  Hypertension ? Cont Intuniv  Anion gap acidosis due to seizure  Check salicylate Check lactic acid Check ethanol Check urine drug screen Check cmp in am   DVT Prophylaxis-   Lovenox - SCDs   AM Labs Ordered, also please review Full Orders  Family Communication: Admission, patients condition and plan of care including tests being ordered have been discussed with the patient and mother who indicate understanding and agree with the plan and Code Status.  Code Status:  FULL CODE, family (mother) is present with the patient and admission and treatment plan discussed  Admission status: Observation: Based on patients clinical presentation and evaluation of above clinical data, I have made determination that patient meets observation criteria at this time.  Time spent in minutes : 55    Pearson GrippeJames Dejour Vos M.D on 11/13/2018 at 11:31 PM

## 2018-11-13 NOTE — ED Provider Notes (Signed)
[facility] DEPT Provider Note   CSN: [id number] Arrival date & time: 11/13/18  2006    History   Chief Complaint No chief complaint on file.   HPI Dana Mcneil is a 28 y.o. female.     HPI Patient with [name], mental retardation presents with her mother who provides much of the HPI. She arrives via private vehicle, pulling up into the ambulance bay due to seizure activity. Mother notes the patient has no history of seizures, but is on multiple medication for behavioral disturbance. She notes that she had a medication change about 1 week ago. Today the patient had one seizure at home another 1 in route, and soon after arrival into the emergency department had a third seizure. Patient herself cannot provide any details of the HPI, level 5 caveat secondary to acuity of condition and mental status changes. Mother notes no recent other changes including fever, gross infectious illness. A list of recent medication changes include notation of cessation of Depakote, and Klonopin 1 week ago. Past Medical History:  Diagnosis Date  . [name]   . MR (mental retardation)     Patient Active Problem List   Diagnosis Date Noted  . Abdominal distension   . CAP (community acquired pneumonia) 10/21/2018  . Elevated blood pressure reading without diagnosis of hypertension 10/12/2018  . Pneumonia 07/22/2018  . Dysuria 07/20/2018  . Tachycardia 07/20/2018  . Snoring 07/20/2018  . Obesity 07/20/2018  . Nonintractable headache 07/20/2018  . Murmur 07/20/2018  . Anemia 06/18/2018  . Edema 06/18/2018  . Constipation 06/18/2018  . Bloating 06/18/2018  . High risk medication use 03/11/2018  . 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 Need for influenza vaccination 03/11/2018  . Allergic rhinitis due to pollen 03/11/2018  . Anxiety 03/11/2018  . Screening for lipid disorders 03/11/2018  . [name]  03/11/2018  . Difficulty with speech 10/22/2015  . Wisdom teeth extracted 07/17/2015  . Dysphagia, pharyngeal phase 04/20/2015  . Vitamin D deficiency 04/20/2015  . Seasonal allergies 10/19/2014  . Weight loss 10/19/2014  . Intellectual disability 04/16/2013  . Irritability and anger 04/16/2013  . Aggression 04/16/2013  . PTSD (post-traumatic stress disorder) 04/16/2013  . Generalized anxiety disorder 10/20/2007  . ESOPHAGEAL REFLUX 10/20/2007  . Dysphagia 10/20/2007    No past surgical history on file.   OB History   No obstetric history on file.      Home Medications    Prior to Admission medications   Medication Sig Start Date End Date Taking? Authorizing Provider  Asenapine Maleate 10 MG SUBL Place 20 mg under the tongue at bedtime.     [provider]  benztropine (COGENTIN) 0.5 MG tablet Take 0.5 mg by mouth 2 (two) times daily.    [provider]  busPIRone (BUSPAR) 10 MG tablet Take 10 mg by mouth 3 (three) times daily.    [provider]  cetirizine (ZYRTEC) 10 MG tablet Take 10 mg by mouth daily.    [provider]  Cholecalciferol (VITAMIN D3) 2000 units capsule Take 2,000 Units by mouth daily.    [provider]  clonazePAM (KLONOPIN) 1 MG tablet Take 1 mg by mouth 3 (three) times daily.    [provider]  divalproex (DEPAKOTE) 250 MG DR tablet Take 750 mg by mouth at bedtime.     [provider]  doxepin (SINEQUAN) 75 MG capsule Take 75 mg by mouth 2 (two) times a  day.     [provider]  fluticasone (FLONASE) 50 MCG/ACT nasal spray Place 1 spray into both nostrils daily. 02/20/18   Petrucelli, Samantha R, PA-C  hydrOXYzine (VISTARIL) 50 MG capsule Take 50 mg by mouth 3 (three) times daily as needed for anxiety.    [provider]  metoprolol tartrate (LOPRESSOR) 25 MG tablet Take 0.5 tablets (12.5 mg total) by mouth daily for 30 days. 10/22/18 11/21/18  Georgette Shell, MD   omeprazole (PRILOSEC) 40 MG capsule TAKE (1) CAPSULE DAILY. 10/26/18   Tysinger, Camelia Eng, PA-C  polyethylene glycol powder (GLYCOLAX/MIRALAX) powder 1 cap full daily in a beverage in the morning Patient taking differently: Take 17 g by mouth daily as needed. 1 cap full daily in a beverage in the morning as needed 06/18/18   Tysinger, Camelia Eng, PA-C  PRESCRIPTION MEDICATION MI Paste Plus. -- Apply pea size amount nightly with finger or Q tip after flossing and brushing until gone    [provider]  sertraline (ZOLOFT) 100 MG tablet Take 100 mg by mouth daily.     [provider]    Family History Family History  Problem Relation Age of Onset  . Diabetes Father   . Cancer Maternal Grandfather   . Hyperlipidemia Other   . Hypertension Other   . Colon cancer Neg Hx   . Colon polyps Neg Hx   . Kidney disease Neg Hx   . Esophageal cancer Neg Hx     Social History Social History   Tobacco Use  . Smoking status: Never Smoker  . Smokeless tobacco: Never Used  Substance Use Topics  . Alcohol use: No    Alcohol/week: 0.0 standard drinks  . Drug use: No     Allergies   Haldol [haloperidol lactate] and Lorazepam   Review of Systems Review of Systems  Unable to perform ROS: Acuity of condition     Physical Exam Updated Vital Signs BP (!) 140/96 (BP Location: Left Arm)   Pulse 71   Temp 98.1 F (36.7 C) (Oral)   Resp 20   Ht '4\' 6"'$  (1.372 m)   Wt 75.8 kg   SpO2 100%   BMI 40.27 kg/m   Physical Exam Vitals signs and nursing note reviewed.  Constitutional:      Appearance: She is well-developed. She is ill-appearing and diaphoretic.     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     Mouth/Throat:     Comments: Evidence for tongue laceration, not completely visualized initially Eyes:     Conjunctiva/sclera: Conjunctivae normal.  Cardiovascular:     Rate and Rhythm: Normal rate and regular rhythm.  Pulmonary:      Effort: Pulmonary effort is normal. No respiratory distress.     Breath sounds: Normal breath sounds. No stridor.  Abdominal:     General: There is no distension.  Skin:    General: Skin is warm.  Neurological:     Comments: Moving all extremities minimally, spontaneously, consistent with postictal phase  Psychiatric:        Cognition and Memory: Cognition is impaired.      ED Treatments / Results  Labs (all labs ordered are listed, but only abnormal results are displayed) Labs Reviewed  COMPREHENSIVE METABOLIC PANEL - Abnormal; Notable for the following components:      Result Value   Potassium 3.4 (*)    CO2 14 (*)    Glucose, Bld 106 (*)  Calcium 8.8 (*)    Anion gap 16 (*)    All other components within normal limits  CBC WITH DIFFERENTIAL/PLATELET - Abnormal; Notable for the following components:   Platelets 113 (*)    All other components within normal limits  SARS CORONAVIRUS 2 (HOSPITAL ORDER, [facility])    EKG None  Radiology No results found.  Procedures Procedures (including critical care time)  Medications Ordered in ED Medications  diazepam (DIASTAT) rectal kit 5 mg (5 mg Rectal Given 11/13/18 2035)  valproate (DEPACON) 250 mg in dextrose 5 % 50 mL IVPB (0 mg Intravenous Stopped 11/13/18 2236)  midazolam (VERSED) injection 2 mg (2 mg Intravenous Given 11/13/18 2306)     Initial Impression / Assessment and Plan / ED Course  I have reviewed the triage vital signs and the nursing notes.  Pertinent labs & imaging results that were available during my care of the patient were reviewed by me and considered in my medical decision making (see chart for details).    After the initial evaluation with concern for ongoing seizure activity I discussed the patient's allergies with her mother.  When she notes that she has a history to Ativan, but only this benzodiazepine, has previously been prescribed Klonopin.        With  consideration of the recent medication changes, including cessation of both Depakote and clonazepam, there are 2 considerations likely explaining the patient seizure activity. After the initial evaluation with concern for seizure she received rectal Valium, IV Depakote.   Update: Patient now awake, pleasant, though with her cognitive disability, not insightful about her presentation. No evidence for tongue laceration, some small hemostatic lip laceration.   11:15 PM Patient has had an additional seizure activity, has received IV Versed.  I discussed the patient's case with our neurology colleagues, particular given the patient's new seizure activity, and recent cessation of both Depakote and Klonopin. We discussed likelihood of withdrawal, likely from the latter is contributing to her presentation. Here, again she has received rectal and IV benzodiazepine, as well as IV Depakote.  Labs reassuring, physical exam reassuring, and given her period of return to normal interactivity, according to her parents who are at bedside, there is some reassurance for withdrawal contributing to her new seizure activity.   Final Clinical Impressions(s) / ED Diagnoses   Final diagnoses:  Seizure ([place])   CRITICAL CARE Performed by: Carmin Muskrat Total critical care time: 35 minutes Critical care time was exclusive of separately billable procedures and treating other patients. Critical care was necessary to treat or prevent imminent or life-threatening deterioration. Critical care was time spent personally by me on the following activities: development of treatment plan with patient and/or surrogate as well as nursing, discussions with consultants, evaluation of patient's response to treatment, examination of patient, obtaining history from patient or surrogate, ordering and performing treatments and interventions, ordering and review of laboratory studies, ordering and review of radiographic studies, pulse  oximetry and re-evaluation of patient's condition.    Carmin Muskrat, MD 11/13/18 2320

## 2018-11-13 NOTE — ED Notes (Signed)
Patient transported to x-ray. ?

## 2018-11-13 NOTE — ED Notes (Signed)
Patient had a seizure that lasted approximately 30 seconds. Patient placed on 2 L of O2 via [place] and turned on right side. MD made aware of seizure activity. Patient is currently post ictal at this time. Will continue to monitor.

## 2018-11-14 ENCOUNTER — Encounter (HOSPITAL_COMMUNITY): Payer: Self-pay | Admitting: Internal Medicine

## 2018-11-14 ENCOUNTER — Inpatient Hospital Stay (HOSPITAL_COMMUNITY): Payer: Medicaid Other

## 2018-11-14 DIAGNOSIS — R4689 Other symptoms and signs involving appearance and behavior: Secondary | ICD-10-CM

## 2018-11-14 DIAGNOSIS — Z888 Allergy status to other drugs, medicaments and biological substances status: Secondary | ICD-10-CM | POA: Diagnosis not present

## 2018-11-14 DIAGNOSIS — Q8719 Other congenital malformation syndromes predominantly associated with short stature: Secondary | ICD-10-CM | POA: Diagnosis not present

## 2018-11-14 DIAGNOSIS — S01551A Open bite of lip, initial encounter: Secondary | ICD-10-CM | POA: Diagnosis present

## 2018-11-14 DIAGNOSIS — F319 Bipolar disorder, unspecified: Secondary | ICD-10-CM

## 2018-11-14 DIAGNOSIS — F4310 Post-traumatic stress disorder, unspecified: Secondary | ICD-10-CM | POA: Diagnosis present

## 2018-11-14 DIAGNOSIS — Z7951 Long term (current) use of inhaled steroids: Secondary | ICD-10-CM | POA: Diagnosis not present

## 2018-11-14 DIAGNOSIS — R454 Irritability and anger: Secondary | ICD-10-CM | POA: Diagnosis not present

## 2018-11-14 DIAGNOSIS — F79 Unspecified intellectual disabilities: Secondary | ICD-10-CM | POA: Diagnosis present

## 2018-11-14 DIAGNOSIS — K219 Gastro-esophageal reflux disease without esophagitis: Secondary | ICD-10-CM | POA: Diagnosis present

## 2018-11-14 DIAGNOSIS — X58XXXA Exposure to other specified factors, initial encounter: Secondary | ICD-10-CM | POA: Diagnosis present

## 2018-11-14 DIAGNOSIS — Z8249 Family history of ischemic heart disease and other diseases of the circulatory system: Secondary | ICD-10-CM | POA: Diagnosis not present

## 2018-11-14 DIAGNOSIS — Z833 Family history of diabetes mellitus: Secondary | ICD-10-CM | POA: Diagnosis not present

## 2018-11-14 DIAGNOSIS — Z79899 Other long term (current) drug therapy: Secondary | ICD-10-CM | POA: Diagnosis not present

## 2018-11-14 DIAGNOSIS — R6250 Unspecified lack of expected normal physiological development in childhood: Secondary | ICD-10-CM | POA: Diagnosis present

## 2018-11-14 DIAGNOSIS — Z1159 Encounter for screening for other viral diseases: Secondary | ICD-10-CM | POA: Diagnosis not present

## 2018-11-14 DIAGNOSIS — G9349 Other encephalopathy: Secondary | ICD-10-CM | POA: Diagnosis present

## 2018-11-14 DIAGNOSIS — F411 Generalized anxiety disorder: Secondary | ICD-10-CM | POA: Diagnosis present

## 2018-11-14 DIAGNOSIS — Q02 Microcephaly: Secondary | ICD-10-CM | POA: Diagnosis not present

## 2018-11-14 DIAGNOSIS — Z6841 Body Mass Index (BMI) 40.0 and over, adult: Secondary | ICD-10-CM | POA: Diagnosis not present

## 2018-11-14 DIAGNOSIS — R569 Unspecified convulsions: Secondary | ICD-10-CM | POA: Diagnosis present

## 2018-11-14 DIAGNOSIS — E872 Acidosis: Secondary | ICD-10-CM | POA: Diagnosis present

## 2018-11-14 DIAGNOSIS — E669 Obesity, unspecified: Secondary | ICD-10-CM | POA: Diagnosis present

## 2018-11-14 HISTORY — DX: Bipolar disorder, unspecified: F31.9

## 2018-11-14 LAB — URINALYSIS, ROUTINE W REFLEX MICROSCOPIC
Bilirubin Urine: NEGATIVE
Glucose, UA: NEGATIVE mg/dL
Hgb urine dipstick: NEGATIVE
Ketones, ur: NEGATIVE mg/dL
Leukocytes,Ua: NEGATIVE
Nitrite: NEGATIVE
Protein, ur: NEGATIVE mg/dL
Specific Gravity, Urine: 1.009 (ref 1.005–1.030)
pH: 7 (ref 5.0–8.0)

## 2018-11-14 LAB — RAPID URINE DRUG SCREEN, HOSP PERFORMED
Amphetamines: NOT DETECTED
Barbiturates: NOT DETECTED
Benzodiazepines: POSITIVE — AB
Cocaine: NOT DETECTED
Opiates: NOT DETECTED
Tetrahydrocannabinol: NOT DETECTED

## 2018-11-14 LAB — COMPREHENSIVE METABOLIC PANEL
ALT: 19 U/L (ref 0–44)
AST: 13 U/L — ABNORMAL LOW (ref 15–41)
Albumin: 3.8 g/dL (ref 3.5–5.0)
Alkaline Phosphatase: 66 U/L (ref 38–126)
Anion gap: 10 (ref 5–15)
BUN: 15 mg/dL (ref 6–20)
CO2: 19 mmol/L — ABNORMAL LOW (ref 22–32)
Calcium: 8.9 mg/dL (ref 8.9–10.3)
Chloride: 109 mmol/L (ref 98–111)
Creatinine, Ser: 0.77 mg/dL (ref 0.44–1.00)
GFR calc Af Amer: >60 mL/min (ref >60)
GFR calc non Af Amer: >60 mL/min (ref >60)
Glucose, Bld: 93 mg/dL (ref 70–99)
Potassium: 3.5 mmol/L (ref 3.5–5.1)
Sodium: 138 mmol/L (ref 135–145)
Total Bilirubin: 0.7 mg/dL (ref 0.3–1.2)
Total Protein: 7.3 g/dL (ref 6.5–8.1)

## 2018-11-14 LAB — SARS CORONAVIRUS 2 BY RT PCR (HOSPITAL ORDER, PERFORMED IN ~~LOC~~ HOSPITAL LAB): SARS Coronavirus 2: NEGATIVE

## 2018-11-14 LAB — ACETAMINOPHEN LEVEL: Acetaminophen (Tylenol), Serum: <10 ug/mL — ABNORMAL LOW (ref 10–30)

## 2018-11-14 LAB — GLUCOSE, CAPILLARY: Glucose-Capillary: 101 mg/dL — ABNORMAL HIGH (ref 70–99)

## 2018-11-14 LAB — SALICYLATE LEVEL: Salicylate Lvl: <7 mg/dL (ref 2.8–30.0)

## 2018-11-14 LAB — MAGNESIUM: Magnesium: 1.7 mg/dL (ref 1.7–2.4)

## 2018-11-14 LAB — LACTIC ACID, PLASMA: Lactic Acid, Venous: 0.7 mmol/L (ref 0.5–1.9)

## 2018-11-14 MED ORDER — CLONAZEPAM 0.5 MG PO TABS: 0.5000 mg | ORAL_TABLET | Freq: Every day | ORAL | Status: DC

## 2018-11-14 MED ORDER — SERTRALINE HCL 100 MG PO TABS
100.0000 mg | ORAL_TABLET | Freq: Every day | ORAL | Status: DC
  Administered 2018-11-14 – 2018-11-16 (×3): 100 mg via ORAL
  Filled 2018-11-14: qty 1
  Filled 2018-11-14: qty 2
  Filled 2018-11-14: qty 1

## 2018-11-14 MED ORDER — DIAZEPAM 5 MG/ML IJ SOLN
5.0000 mg | Freq: Once | INTRAMUSCULAR | Status: AC | PRN
  Administered 2018-11-14: 5 mg via INTRAVENOUS
  Filled 2018-11-14: qty 2

## 2018-11-14 MED ORDER — METOPROLOL TARTRATE 12.5 MG HALF TABLET
12.5000 mg | ORAL_TABLET | Freq: Every day | ORAL | Status: DC
  Administered 2018-11-14 – 2018-11-16 (×3): 12.5 mg via ORAL
  Filled 2018-11-14 (×3): qty 1

## 2018-11-14 MED ORDER — VITAMIN D 25 MCG (1000 UNIT) PO TABS
2000.0000 [IU] | ORAL_TABLET | Freq: Every day | ORAL | Status: DC
  Administered 2018-11-15 – 2018-11-16 (×2): 2000 [IU] via ORAL
  Filled 2018-11-14 (×2): qty 2

## 2018-11-14 MED ORDER — MAGNESIUM SULFATE 2 GM/50ML IV SOLN
2.0000 g | Freq: Once | INTRAVENOUS | Status: AC
  Administered 2018-11-14: 2 g via INTRAVENOUS
  Filled 2018-11-14: qty 50

## 2018-11-14 MED ORDER — GUANFACINE HCL ER 1 MG PO TB24
2.0000 mg | ORAL_TABLET | Freq: Every day | ORAL | Status: DC
  Administered 2018-11-14 – 2018-11-16 (×3): 2 mg via ORAL
  Filled 2018-11-14 (×3): qty 2

## 2018-11-14 MED ORDER — TOPIRAMATE 25 MG PO TABS
50.0000 mg | ORAL_TABLET | Freq: Two times a day (BID) | ORAL | Status: DC
  Administered 2018-11-14 – 2018-11-16 (×6): 50 mg via ORAL
  Filled 2018-11-14 (×6): qty 2

## 2018-11-14 MED ORDER — BUSPIRONE HCL 10 MG PO TABS
10.0000 mg | ORAL_TABLET | Freq: Three times a day (TID) | ORAL | Status: DC
  Administered 2018-11-14 – 2018-11-16 (×7): 10 mg via ORAL
  Filled 2018-11-14 (×3): qty 1
  Filled 2018-11-14: qty 2
  Filled 2018-11-14 (×4): qty 1

## 2018-11-14 MED ORDER — POLYETHYLENE GLYCOL 3350 17 G PO PACK
17.0000 g | PACK | ORAL | Status: DC
  Administered 2018-11-15: 17 g via ORAL
  Filled 2018-11-14 (×2): qty 1

## 2018-11-14 MED ORDER — CLONAZEPAM 0.5 MG PO TABS
1.0000 mg | ORAL_TABLET | Freq: Three times a day (TID) | ORAL | Status: DC
  Administered 2018-11-14: 1 mg via ORAL
  Filled 2018-11-14: qty 2

## 2018-11-14 MED ORDER — PANTOPRAZOLE SODIUM 40 MG PO TBEC
40.0000 mg | DELAYED_RELEASE_TABLET | Freq: Every day | ORAL | Status: DC
  Administered 2018-11-14 – 2018-11-16 (×3): 40 mg via ORAL
  Filled 2018-11-14 (×3): qty 1

## 2018-11-14 MED ORDER — VITAMIN C 500 MG PO TABS
500.0000 mg | ORAL_TABLET | Freq: Every day | ORAL | Status: DC
  Administered 2018-11-14 – 2018-11-16 (×3): 500 mg via ORAL
  Filled 2018-11-14 (×3): qty 1

## 2018-11-14 MED ORDER — DIAZEPAM 5 MG/ML IJ SOLN: 5.0000 mg | INTRAMUSCULAR | Status: DC | PRN

## 2018-11-14 MED ORDER — SODIUM CHLORIDE 0.9 % IV SOLN
INTRAVENOUS | Status: AC
  Administered 2018-11-14 – 2018-11-16 (×4): via INTRAVENOUS

## 2018-11-14 MED ORDER — ZIPRASIDONE HCL 40 MG PO CAPS
60.0000 mg | ORAL_CAPSULE | Freq: Every day | ORAL | Status: DC
  Administered 2018-11-14 – 2018-11-16 (×3): 60 mg via ORAL
  Filled 2018-11-14: qty 3
  Filled 2018-11-14 (×2): qty 1

## 2018-11-14 MED ORDER — POTASSIUM CHLORIDE 20 MEQ PO PACK
40.0000 meq | PACK | Freq: Once | ORAL | Status: AC
  Administered 2018-11-14: 40 meq via ORAL
  Filled 2018-11-14: qty 2

## 2018-11-14 MED ORDER — CLONAZEPAM 0.5 MG PO TABS
1.0000 mg | ORAL_TABLET | Freq: Two times a day (BID) | ORAL | Status: DC
  Administered 2018-11-14 – 2018-11-16 (×4): 1 mg via ORAL
  Filled 2018-11-14 (×4): qty 2

## 2018-11-14 MED ORDER — CLONAZEPAM 0.5 MG PO TABS
0.5000 mg | ORAL_TABLET | Freq: Once | ORAL | Status: AC
  Administered 2018-11-14: 0.5 mg via ORAL
  Filled 2018-11-14: qty 1

## 2018-11-14 MED ORDER — LIP MEDEX EX OINT
TOPICAL_OINTMENT | CUTANEOUS | Status: AC
  Administered 2018-11-14: 1
  Filled 2018-11-14: qty 7

## 2018-11-14 MED ORDER — NALTREXONE HCL 50 MG PO TABS
25.0000 mg | ORAL_TABLET | Freq: Two times a day (BID) | ORAL | Status: DC
  Administered 2018-11-14 – 2018-11-16 (×6): 25 mg via ORAL
  Filled 2018-11-14 (×7): qty 1

## 2018-11-14 MED ORDER — CLONAZEPAM 0.5 MG PO TABS
0.5000 mg | ORAL_TABLET | Freq: Three times a day (TID) | ORAL | Status: DC
  Administered 2018-11-14: 08:00:00 0.5 mg via ORAL
  Filled 2018-11-14: qty 1

## 2018-11-14 NOTE — Progress Notes (Signed)
Made NP [place]imer aware that the Pt, has had a total of six seizures four in the Ed and two on the 4 [place] a one time order of 5 mg of IV Valium given around 5: 30 am. I will continue to monitor.

## 2018-11-14 NOTE — Progress Notes (Signed)
SLP Cancellation Note  Patient Details Name: Dana Mcneil MRN: [id number] DOB: [birth year]   Cancelled treatment:       Reason Eval/Treat Not Completed: Other (comment) Order received for swallow and cognitive evaluations, although per MD note, pt is to remain NPO pending neurology consult. Seizure-like activity also noted earlier this morning, although mother reportedly thinks her mentation is near baseline. Will f/u for readiness to complete evaluations.    Venita Sheffield [name] 11/14/2018, 11:28 AM  Pollyann Glen, M.A. [facility] Acute Environmental education officer [phone] Office [phone]

## 2018-11-14 NOTE — ED Notes (Signed)
ED TO INPATIENT HANDOFF REPORT  Name/Age/Gender Dana Mcneil 28 y.o. female  Code Status    Code Status Orders  (From admission, onward)         Start     Ordered   11/13/18 2323  Full code  Continuous     11/13/18 2324        Code Status History    Date Active Date Inactive Code Status Order ID Comments User Context   10/21/2018 2135 10/22/2018 1630 Full Code [id number]  Rise Patience, MD ED   04/15/2013 1344 04/17/2013 2006 Full Code [id number]  Erick Blinks ED   Advance Care Planning Activity      Home/SNF/Other Home  Chief Complaint seizures  Level of Care/Admitting Diagnosis ED Disposition    ED Disposition Condition [place]: [facility] [[id number]]  Level of Care: Telemetry [5]  Admit to tele based on following criteria: Other see comments  Comments: monitor for arrythmia  Covid Evaluation: Screening Protocol (No Symptoms)  Diagnosis: Seizure [facility]) [[id number]]  Admitting Physician: Jani Gravel [[id number]]  Attending Physician: Jani Gravel [[id number]]  PT Class (Do Not Modify): Observation [104]  PT Acc Code (Do Not Modify): Observation [10022]       Medical History Past Medical History:  Diagnosis Date  . [name]   . MR (mental retardation)     Allergies Allergies  Allergen Reactions  . Haldol [Haloperidol Lactate]     hyperactive  . Lorazepam     Ativan- hyperactive    IV Location/Drains/Wounds Patient Lines/Drains/Airways Status   Active Line/Drains/Airways    Name:   Placement date:   Placement time:   Site:   Days:   Peripheral IV 11/13/18 Left Antecubital   11/13/18    2133    Antecubital   1          Labs/Imaging Results for orders placed or performed during the hospital encounter of 11/13/18 (from the past 48 hour(s))  Comprehensive metabolic panel     Status: Abnormal   Collection Time: 11/13/18  8:24 PM  Result Value Ref Range   Sodium 140 135 - 145 mmol/L   Potassium 3.4 (L) 3.5 - 5.1 mmol/L   Chloride 110 98 - 111 mmol/L   CO2 14 (L) 22 - 32 mmol/L   Glucose, Bld 106 (H) 70 - 99 mg/dL   BUN 17 6 - 20 mg/dL   Creatinine, Ser 0.95 0.44 - 1.00 mg/dL   Calcium 8.8 (L) 8.9 - 10.3 mg/dL   Total Protein 7.9 6.5 - 8.1 g/dL   Albumin 4.5 3.5 - 5.0 g/dL   AST 21 15 - 41 U/L   ALT 21 0 - 44 U/L   Alkaline Phosphatase 70 38 - 126 U/L   Total Bilirubin 0.4 0.3 - 1.2 mg/dL   GFR calc non Af Amer >60 >60 mL/min   GFR calc Af Amer >60 >60 mL/min   Anion gap 16 (H) 5 - 15    Comment: Performed at [facility], [place] [address], [place], [place] [address]  CBC with Differential     Status: Abnormal   Collection Time: 11/13/18  8:24 PM  Result Value Ref Range   WBC 7.8 4.0 - 10.5 K/uL   RBC 3.97 3.87 - 5.11 MIL/uL   Hemoglobin 12.1 12.0 - 15.0 g/dL   HCT 39.3 36.0 - 46.0 %   MCV 99.0 80.0 - 100.0 fL  MCH 30.5 26.0 - 34.0 pg   MCHC 30.8 30.0 - 36.0 g/dL   RDW 12.3 11.5 - 15.5 %   Platelets 113 (L) 150 - 400 K/uL    Comment: PLATELET COUNT CONFIRMED BY SMEAR SPECIMEN CHECKED FOR CLOTS Immature Platelet Fraction may be clinically indicated, consider ordering this additional test [id number]    nRBC 0.0 0.0 - 0.2 %   Neutrophils Relative % 57 %   Neutro Abs 4.5 1.7 - 7.7 K/uL   Lymphocytes Relative 31 %   Lymphs Abs 2.5 0.7 - 4.0 K/uL   Monocytes Relative 10 %   Monocytes Absolute 0.8 0.1 - 1.0 K/uL   Eosinophils Relative 1 %   Eosinophils Absolute 0.1 0.0 - 0.5 K/uL   Basophils Relative 0 %   Basophils Absolute 0.0 0.0 - 0.1 K/uL   WBC Morphology MILD LEFT SHIFT (1-5% METAS, OCC MYELO, OCC BANDS)     Comment: TOXIC GRANULATION   Immature Granulocytes 1 %   Abs Immature Granulocytes 0.05 0.00 - 0.07 K/uL    Comment: Performed at [facility], [place] [address] [place], [place] [address]   Dg Chest 1 View  Result Date: 11/14/2018 CLINICAL DATA:  Seizure EXAM: CHEST  1 VIEW COMPARISON:  Portable exam [id number]  hours compared to 10/21/2018 FINDINGS: Upper normal size of cardiac silhouette. Mediastinal contours and pulmonary vascularity normal. Low lung volumes. No definite acute infiltrate, pleural effusion or pneumothorax. Bones demineralized. IMPRESSION: No definite acute infiltrates. Electronically Signed   By: Lavonia Dana M.D.   On: 11/14/2018 00:01   Ct Head Wo Contrast  Result Date: 11/14/2018 CLINICAL DATA:  Seizure activity.  [name] EXAM: CT HEAD WITHOUT CONTRAST TECHNIQUE: Contiguous axial images were obtained from the base of the skull through the vertex without intravenous contrast. COMPARISON:  None. FINDINGS: Brain: There is no mass, hemorrhage or extra-axial collection. The ventricles are decreased in size relative to the prior study. Gray-white differentiation is maintained. Vascular: No abnormal hyperdensity of the major intracranial arteries or dural venous sinuses. No intracranial atherosclerosis. Skull: The visualized skull base, calvarium and extracranial soft tissues are normal. Sinuses/Orbits: The left frontal sinus, left maxillary sinus and left anterior ethmoid air cells are opacified. No mastoid or middle ear effusion. The orbits are normal. IMPRESSION: No acute intracranial abnormality. Chronic left ostiomeatal complex sinus disease. Electronically Signed   By: Ulyses Jarred M.D.   On: 11/14/2018 00:24    Pending Labs Unresulted Labs (From admission, onward)    Start     Ordered   11/20/18 0500  Creatinine, serum  (enoxaparin (LOVENOX)    CrCl >/= 30 ml/min)  Weekly,   R    Comments: while on enoxaparin therapy    11/13/18 2324   11/14/18 0500  Comprehensive metabolic panel  Tomorrow morning,   R     11/13/18 2324   11/14/18 0500  CBC  Tomorrow morning,   R     11/13/18 2324   11/13/18 2328  Acetaminophen level  Once,   STAT     11/13/18 2327   11/13/18 2326  Urine rapid drug screen (hosp performed)  ONCE - STAT,   STAT     11/13/18 2327   67/34/19 3790   Salicylate level  Once,   STAT     11/13/18 2327   11/13/18 2326  Lactic acid, plasma  Once,   STAT     11/13/18 2327   11/13/18 2326  Urinalysis, Routine w reflex  microscopic  Once,   STAT     11/13/18 2327   11/13/18 2325  Ethanol  Once,   STAT     11/13/18 2327   11/13/18 2306  SARS Coronavirus 2 (CEPHEID - Performed in [place] hospital lab), Hosp Order  (Asymptomatic Patients Labs)  Once,   STAT    Question:  Rule Out  Answer:  Yes   11/13/18 2305          Vitals/Pain Today's Vitals   11/13/18 2030 11/13/18 2206 11/13/18 2330 11/14/18 0000  BP: (!) 118/57 (!) 140/96 (!) 126/93 131/90  Pulse:  71 97 78  Resp: (!) 21 20 (!) 26 15  Temp:      TempSrc:      SpO2:  100% 100% 100%  Weight:      Height:      PainSc:   0-No pain 0-No pain    Isolation Precautions No active isolations  Medications Medications  enoxaparin (LOVENOX) injection 40 mg (has no administration in time range)  sodium chloride flush (NS) 0.9 % injection 3 mL (has no administration in time range)  sodium chloride flush (NS) 0.9 % injection 3 mL (has no administration in time range)  0.9 %  sodium chloride infusion (has no administration in time range)  acetaminophen (TYLENOL) tablet 650 mg (has no administration in time range)    Or  acetaminophen (TYLENOL) suppository 650 mg (has no administration in time range)  diazepam (DIASTAT) rectal kit 5 mg (5 mg Rectal Given 11/13/18 2035)  valproate (DEPACON) 250 mg in dextrose 5 % 50 mL IVPB (0 mg Intravenous Stopped 11/13/18 2236)  midazolam (VERSED) injection 2 mg (2 mg Intravenous Given 11/13/18 2306)    Mobility walks
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
## 2018-11-14 NOTE — Progress Notes (Signed)
Pt tx from the ED, her mother is at the bedside. The Pt had an unwitnessed seizure at 02:15 am per the pt's mother lasting 2 minutes. Topamax 50mg  was given. The pt is alert and able to answer yes and no questions. I will continue to monitor.

## 2018-11-14 NOTE — Progress Notes (Signed)
PROGRESS NOTE    Magnus IvanLauren N Chiao  BJY:[id number] DOB: 1990/10/10 DOA: 11/13/2018 PCP: Jac Canavanysinger, David S, PA-C    Brief Narrative:  HPI per Dr. Stormy FabianKim  Thelda Marlou SaKrulish  is a 28 y.o. 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  Starting this afternoon.  the first seizure lasted about 45 seconds.  The second seizure occurred while she was going to get something from the refrigerator and lasted minutes.  Pt had a 3rd seizure en route to ER, and then a 4th brief seizure while in ED.  Pt bit her lip, and wet her diaper.    Pt's family notes that she her clonazepam and depakote were stopped a week ago, and this may have been a precipitating factor.  No recent head trauma.  No prior history of seizure disorder.   In ED,  T 98.1, P 97  R 26, Bp 126/93  Pox 100% Wt 75.8kg  Wbc 7.8, Hgb 12.1, Plt 113 Na 140, K 3.4 Bun 17, Creatinine 0.95 Glucose 106 Hco3 14 AG 16  Per ED, neurology said that the timing of the seizure is classic for benzo/ depakote withdrawal and to start clonazepam at 0.5mg  po tid  Pt will be admitted for seizure.    Assessment & Plan:   Principal Problem:   Seizure (HCC) Active Problems:   Generalized anxiety disorder   Esophageal reflux   Intellectual disability   Irritability and anger   Aggression   PTSD (post-traumatic stress disorder)   Congenital abnormalities   [name]   Bipolar 1 disorder (HCC)  1 seizures Patient presenting with multiple seizures.  Concerned that likely secondary to withdrawal from abrupt cessation of clonazepam and Depakote which per mother was stopped a week ago.  Patient noted to have had 2 seizures at home prior to presentation with the third seizure on route to the ED, for 3 seizures in the ED, had 2 seizures overnight per mother and 1 seizure early this morning totaling 7 seizures so far.  It was noted that  patient bit her lip and per admitting physician wet her diaper.  On my examination alert following some commands.  Patient with a intellectual disability ([name] ).  Head CT negative.  Lactic acid drawn this morning was 0.7.  Check a magnesium level and keep magnesium greater than 2.  Give a dose of oral potassium to keep potassium greater than 4.  Place on seizure precautions.  Check a EEG.  May need continuous EEG due to multiple seizures.  Continue current dose of clonazepam 0.5 mg 3 times daily.  May need to be resumed back on home regimen of Depakote however will defer to neurology.  Place on Valium as needed as per mother patient gets wild and out-of-control on Ativan.  Case discussed with neuro hospitalist, Dr. Laurence SlateAroor, who recommended transfer [facility] for continuous EEG and further evaluation.  Keep n.p.o. until patient has been assessed by neurology.  2.  Behavioral disturbances/irritability and anger/aggression/PTSD/anxiety/bipolar disorder Per mother improved on current psychiatric medications of Geodon and Zoloft, Topamax.  Clonazepam has been resumed 0.5 mg 3 times daily secondary to problem #1.  Continue BuSpar.  Follow.  3.  Gastroesophageal reflux disease PPI.  4.  History of tachycardia Continue metoprolol 12.5 mg daily.  5. ??  ADHD Continue home regimen of intuniv    DVT prophylaxis: Lovenox Code  Status: Full Family Communication: Updated mother at bedside. Disposition Plan: Transfer to [facility] for probable continuous EEG and further evaluation by neuro hospitalist.   Consultants:   Neuro hospitalist: Pending spoke with Dr. Laurence SlateAroor  Procedures:   CT head 11/13/2018  Chest x-ray 11/13/2018  Antimicrobials:   None   Subjective: Patient laying in bed.  Was informed by nurse that patient had a seizure per mother early on this morning.  No noted urinary or bowel incontinence with seizure.  Patient at bedside alert following  some commands.  Patient answering yes to all questions.  Per mother mentation seems somewhat close to baseline.  Objective: Vitals:   11/14/18 0000 11/14/18 0030 11/14/18 0224 11/14/18 0555  BP: 131/90 130/81 140/77 123/82  Pulse: 78 75 94 94  Resp: 15 16 18    Temp:   99.5 F (37.5 C) 99.1 F (37.3 C)  TempSrc:   Oral Oral  SpO2: 100% 100% 100% 97%  Weight:      Height:        Intake/Output Summary (Last 24 hours) at 11/14/2018 0844 Last data filed at 11/13/2018 2236 Gross per 24 hour  Intake 50 ml  Output -  Net 50 ml   Filed Weights   11/13/18 2017  Weight: 75.8 kg    Examination:  General exam: NAD  Respiratory system: Clear to auscultation. Respiratory effort normal. Cardiovascular system: S1 & S2 heard, RRR. No JVD, murmurs, rubs, gallops or clicks. No pedal edema. Gastrointestinal system: Abdomen is nondistended, soft and nontender. No organomegaly or masses felt. Normal bowel sounds heard. Central nervous system: Alert.  Moving extremities spontaneously.  No focal neurological deficits. Extremities: Symmetric 5 x 5 power. Skin: No rashes, lesions or ulcers Psychiatry: Judgement and insight appear poor. Mood & affect appropriate.     Data Reviewed: I have personally reviewed following labs and imaging studies  CBC: Recent Labs  Lab 11/13/18 2024  WBC 7.8  NEUTROABS 4.5  HGB 12.1  HCT 39.3  MCV 99.0  PLT 113*   Basic Metabolic Panel: Recent Labs  Lab 11/13/18 2024 11/14/18 0744  NA 140 138  K 3.4* 3.5  CL 110 109  CO2 14* 19*  GLUCOSE 106* 93  BUN 17 15  CREATININE 0.95 0.77  CALCIUM 8.8* 8.9  MG  --  1.7   GFR: Estimated Creatinine Clearance: 80.8 mL/min (by C-G formula based on SCr of 0.77 mg/dL). Liver Function Tests: Recent Labs  Lab 11/13/18 2024 11/14/18 0744  AST 21 13*  ALT 21 19  ALKPHOS 70 66  BILITOT 0.4 0.7  PROT 7.9 7.3  ALBUMIN 4.5 3.8   No results for input(s): LIPASE, AMYLASE in the last 168 hours. No results for  input(s): AMMONIA in the last 168 hours. Coagulation Profile: No results for input(s): INR, PROTIME in the last 168 hours. Cardiac Enzymes: No results for input(s): CKTOTAL, CKMB, CKMBINDEX, TROPONINI in the last 168 hours. BNP (last 3 results) No results for input(s): PROBNP in the last 8760 hours. HbA1C: No results for input(s): HGBA1C in the last 72 hours. CBG: Recent Labs  Lab 11/14/18 0736  GLUCAP 101*   Lipid Profile: No results for input(s): CHOL, HDL, LDLCALC, TRIG, CHOLHDL, LDLDIRECT in the last 72 hours. Thyroid Function Tests: No results for input(s): TSH, T4TOTAL, FREET4, T3FREE, THYROIDAB in the last 72 hours. Anemia Panel: No results for input(s): VITAMINB12, FOLATE, FERRITIN, TIBC, IRON, RETICCTPCT in the last 72 hours. Sepsis Labs: Recent Labs  Lab 11/14/18 [phone]  LATICACIDVEN 0.7    Recent Results (from the past 240 hour(s))  SARS Coronavirus 2 (CEPHEID - Performed in [facility] hospital lab), Hosp Order     Status: None   Collection Time: 11/13/18 11:30 PM   Specimen: Nasopharyngeal Swab  Result Value Ref Range Status   SARS Coronavirus 2 NEGATIVE NEGATIVE Final    Comment: (NOTE) If result is NEGATIVE SARS-CoV-2 target nucleic acids are NOT DETECTED. The SARS-CoV-2 RNA is generally detectable in upper and lower  respiratory specimens during the acute phase of infection. The lowest  concentration of SARS-CoV-2 viral copies this assay can detect is 250  copies / mL. A negative result does not preclude SARS-CoV-2 infection  and should not be used as the sole basis for treatment or other  patient management decisions.  A negative result may occur with  improper specimen collection / handling, submission of specimen other  than nasopharyngeal swab, presence of viral mutation(s) within the  areas targeted by this assay, and inadequate number of viral copies  (<250 copies / mL). A negative result must be combined with clinical  observations, patient history,  and epidemiological information. If result is POSITIVE SARS-CoV-2 target nucleic acids are DETECTED. The SARS-CoV-2 RNA is generally detectable in upper and lower  respiratory specimens dur ing the acute phase of infection.  Positive  results are indicative of active infection with SARS-CoV-2.  Clinical  correlation with patient history and other diagnostic information is  necessary to determine patient infection status.  Positive results do  not rule out bacterial infection or co-infection with other viruses. If result is PRESUMPTIVE POSTIVE SARS-CoV-2 nucleic acids MAY BE PRESENT.   A presumptive positive result was obtained on the submitted specimen  and confirmed on repeat testing.  While 2019 novel coronavirus  (SARS-CoV-2) nucleic acids may be present in the submitted sample  additional confirmatory testing may be necessary for epidemiological  and / or clinical management purposes  to differentiate between  SARS-CoV-2 and other Sarbecovirus currently known to infect humans.  If clinically indicated additional testing with an alternate test  methodology [phone]([id number]) is advised. The SARS-CoV-2 RNA is generally  detectable in upper and lower respiratory sp ecimens during the acute  phase of infection. The expected result is Negative. Fact Sheet for Patients:  BoilerBrush.com.cyhttps://www.fda.gov/media/136312/download Fact Sheet for Healthcare Providers: https://pope.com/https://www.fda.gov/media/136313/download This test is not yet approved or cleared by the [place] FDA and has been authorized for detection and/or diagnosis of SARS-CoV-2 by FDA under an Emergency Use Authorization (EUA).  This EUA will remain in effect (meaning this test can be used) for the duration of the COVID-19 declaration under Section 564(b)(1) of the Act, 21 U.S.C. section 360bbb-3(b)(1), unless the authorization is terminated or revoked sooner. Performed at [facility], [address] [place], [place] [address]           Radiology Studies: Dg Chest 1 View  Result Date: 11/14/2018 CLINICAL DATA:  Seizure EXAM: CHEST  1 VIEW COMPARISON:  Portable exam 2348 hours compared to 10/21/2018 FINDINGS: Upper normal size of cardiac silhouette. Mediastinal contours and pulmonary vascularity normal. Low lung volumes. No definite acute infiltrate, pleural effusion or pneumothorax. Bones demineralized. IMPRESSION: No definite acute infiltrates. Electronically Signed   By: Ulyses SouthwardMark  Boles M.D.   On: 11/14/2018 00:01   Ct Head Wo Contrast  Result Date: 11/14/2018 CLINICAL DATA:  Seizure activity.  [name] EXAM: CT HEAD WITHOUT CONTRAST TECHNIQUE: Contiguous axial images were obtained from the base of  the skull through the vertex without intravenous contrast. COMPARISON:  None. FINDINGS: Brain: There is no mass, hemorrhage or extra-axial collection. The ventricles are decreased in size relative to the prior study. Gray-white differentiation is maintained. Vascular: No abnormal hyperdensity of the major intracranial arteries or dural venous sinuses. No intracranial atherosclerosis. Skull: The visualized skull base, calvarium and extracranial soft tissues are normal. Sinuses/Orbits: The left frontal sinus, left maxillary sinus and left anterior ethmoid air cells are opacified. No mastoid or middle ear effusion. The orbits are normal. IMPRESSION: No acute intracranial abnormality. Chronic left ostiomeatal complex sinus disease. Electronically Signed   By: Ulyses Jarred M.D.   On: 11/14/2018 00:24        Scheduled Meds: . busPIRone  10 mg Oral TID  . cholecalciferol  2,000 Units Oral Daily  . clonazePAM  0.5 mg Oral TID  . enoxaparin (LOVENOX) injection  40 mg Subcutaneous Daily  . guanFACINE  2 mg Oral Daily  . metoprolol tartrate  12.5 mg Oral Daily  . naltrexone  25 mg Oral BID  . pantoprazole  40 mg Oral Daily  . [START ON 11/15/2018] polyethylene glycol  17 g Oral 3 times weekly  . sertraline   100 mg Oral Daily  . sodium chloride flush  3 mL Intravenous Q12H  . topiramate  50 mg Oral BID  . vitamin C  500 mg Oral Daily  . ziprasidone  60 mg Oral Daily   Continuous Infusions: . sodium chloride 10 mL/hr at 11/14/18 0334     LOS: 0 days    Time spent: 40 minutes    Irine Seal, MD [facility]  If 7PM-7AM, please contact night-coverage [email or web address] 11/14/2018, 8:44 AM

## 2018-11-14 NOTE — ED Notes (Signed)
Blood draw unsuccessful 

## 2018-11-14 NOTE — ED Notes (Signed)
Hospitalist at bedside 

## 2018-11-14 NOTE — Progress Notes (Signed)
LTM video EEG started. Neuro notified. Tested event button. No skin breakdown at initial hook up

## 2018-11-14 NOTE — Progress Notes (Signed)
This RN called to room by mother stating patient "just had another seizure".  RN noted no seizure activity when she came into room.  Patient appears sleepy, not answering mothers questions.  Mother reports this is her 7th seizure since starting yesterday afternoon.  Per mother patient did state she had a headache prior to having the seizure.  Will notify MD.

## 2018-11-14 NOTE — Progress Notes (Addendum)
Report called to 3W and nurse informed to page neurologist on patient arrival to unit, care link called, care link on the way [facility]. Patient's mother informed/updated.

## 2018-11-15 DIAGNOSIS — K219 Gastro-esophageal reflux disease without esophagitis: Secondary | ICD-10-CM

## 2018-11-15 DIAGNOSIS — R569 Unspecified convulsions: Principal | ICD-10-CM

## 2018-11-15 LAB — CBC WITH DIFFERENTIAL/PLATELET
Abs Immature Granulocytes: 0.04 10*3/uL (ref 0.00–0.07)
Basophils Absolute: 0 10*3/uL (ref 0.0–0.1)
Basophils Relative: 0 %
Eosinophils Absolute: 0.1 10*3/uL (ref 0.0–0.5)
Eosinophils Relative: 1 %
HCT: 34.8 % — ABNORMAL LOW (ref 36.0–46.0)
Hemoglobin: 11.8 g/dL — ABNORMAL LOW (ref 12.0–15.0)
Immature Granulocytes: 0 %
Lymphocytes Relative: 27 %
Lymphs Abs: 2.6 10*3/uL (ref 0.7–4.0)
MCH: 30.9 pg (ref 26.0–34.0)
MCHC: 33.9 g/dL (ref 30.0–36.0)
MCV: 91.1 fL (ref 80.0–100.0)
Monocytes Absolute: 1 10*3/uL (ref 0.1–1.0)
Monocytes Relative: 10 %
Neutro Abs: 6.1 10*3/uL (ref 1.7–7.7)
Neutrophils Relative %: 62 %
Platelets: 206 10*3/uL (ref 150–400)
RBC: 3.82 MIL/uL — ABNORMAL LOW (ref 3.87–5.11)
RDW: 12.1 % (ref 11.5–15.5)
WBC: 9.8 10*3/uL (ref 4.0–10.5)
nRBC: 0 % (ref 0.0–0.2)

## 2018-11-15 LAB — BASIC METABOLIC PANEL
Anion gap: 10 (ref 5–15)
BUN: 11 mg/dL (ref 6–20)
CO2: 19 mmol/L — ABNORMAL LOW (ref 22–32)
Calcium: 8.9 mg/dL (ref 8.9–10.3)
Chloride: 109 mmol/L (ref 98–111)
Creatinine, Ser: 0.97 mg/dL (ref 0.44–1.00)
GFR calc Af Amer: >60 mL/min (ref >60)
GFR calc non Af Amer: >60 mL/min (ref >60)
Glucose, Bld: 79 mg/dL (ref 70–99)
Potassium: 3 mmol/L — ABNORMAL LOW (ref 3.5–5.1)
Sodium: 138 mmol/L (ref 135–145)

## 2018-11-15 LAB — MAGNESIUM: Magnesium: 1.9 mg/dL (ref 1.7–2.4)

## 2018-11-15 MED ORDER — POTASSIUM CHLORIDE CRYS ER 20 MEQ PO TBCR
40.0000 meq | EXTENDED_RELEASE_TABLET | Freq: Once | ORAL | Status: AC
  Administered 2018-11-15: 40 meq via ORAL
  Filled 2018-11-15: qty 4

## 2018-11-15 NOTE — Progress Notes (Signed)
Maint. LTM no skin breakdown UD3,H4,H8

## 2018-11-15 NOTE — Procedures (Addendum)
Electroencephalogram report.  Long-term monitoring.  CPT [id number]  Recording begins 11/14/2018 at 1423 Recording ends 11/15/2018 at 730  The requisition: [employer] 10-20 for electrode placement.  18 channels EEG with additional eyes linked to ipsilateral ears and EKG.  Additional T1-T2 electrodes were also used.  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  Several pushbutton activation events were present around 07/30/2021, 1630 midnight and 1 AM.  Reason for pushbutton unclear possibly due tor accidents.  However there was no EEG changes prior during or after these events of interest.  These were not seizures.  Background activities marked by continuous background activities with reactivity and stage changes.  During wakefulness background activities marked by attenuated low amplitude 10 to 12 cps posterior dominant waking rhythm.  Admixed faster frequencies in the beta range were prominent activity throughout the recording.  Patient had normal drowsiness and sleep architecture.  There was no clinical or subclinical seizures.  No interhemispheric asymmetries.  No focal abnormalities.  No interictal epileptiform discharges present.  Clinical interpretation: This continuous EEG monitoring with simultaneous video monitoring did not record any clinical subclinical seizures.  Background activities were essentially normal throughout the recording.  Pushbutton activation events were not seizures.  If concern for seizures persist continuous monitoring is recommended.  Clinical correlation is advised.

## 2018-11-15 NOTE — Progress Notes (Signed)
Subjective: No seizure activity overnight. LTM EEG running.   Objective: Current vital signs: BP 97/68 (BP Location: Left Arm)   Pulse 88   Temp 98.6 F (37 C) (Oral)   Resp 20   Ht 4\' 6"  (1.372 m)   Wt 75.8 kg   SpO2 100%   BMI 40.27 kg/m  Vital signs in last 24 hours: Temp:  [98.6 F (37 C)-99.3 F (37.4 C)] 98.6 F (37 C) (06/29 0438) Pulse Rate:  [66-88] 88 (06/29 0438) Resp:  [16-20] 20 (06/29 0438) BP: (97-155)/(63-84) 97/68 (06/29 0438) SpO2:  [99 %-100 %] 100 % (06/29 0438)  Intake/Output from previous day: 06/28 0701 - 06/29 0700 In: 321.6 [I.V.:321.6] Out: -  Intake/Output this shift: No intake/output data recorded. Nutritional status:  Diet Order            Diet NPO time specified Except for: Sips with Meds  Diet effective now             HEENT: Microcephalic. EEG leads in place.  Lungs: Respirations unlabored Ext: Dysmorphic limb morphology noted.   Neurologic Exam: Ment: Mild drowsiness. Increased latency of motor and verbal responses. Able to count fingers. Dysarthric speech with abnormally high pitched tonality. Will gaze at examiner. CN: PERRL. Fixates on examiner's face and counts fingers.  Motor: Moves all 4 extremities antigravity without asymmetry. Will resist examiner with upper extremities.  Cerebellar: No gross ataxia noted.  Other: No jerking, twitching or other seizure-like activity noted.   Lab Results: Results for orders placed or performed during the hospital encounter of 11/13/18 (from the past 48 hour(s))  Comprehensive metabolic panel     Status: Abnormal   Collection Time: 11/13/18  8:24 PM  Result Value Ref Range   Sodium 140 135 - 145 mmol/L   Potassium 3.4 (L) 3.5 - 5.1 mmol/L   Chloride 110 98 - 111 mmol/L   CO2 14 (L) 22 - 32 mmol/L   Glucose, Bld 106 (H) 70 - 99 mg/dL   BUN 17 6 - 20 mg/dL   Creatinine, Ser 1.910.95 0.44 - 1.00 mg/dL   Calcium 8.8 (L) 8.9 - 10.3 mg/dL   Total Protein 7.9 6.5 - 8.1 g/dL   Albumin 4.5 3.5  - 5.0 g/dL   AST 21 15 - 41 U/L   ALT 21 0 - 44 U/L   Alkaline Phosphatase 70 38 - 126 U/L   Total Bilirubin 0.4 0.3 - 1.2 mg/dL   GFR calc non Af Amer >60 >60 mL/min   GFR calc Af Amer >60 >60 mL/min   Anion gap 16 (H) 5 - 15    Comment: Performed at [facility], [address] [place], [place] [address]  CBC with Differential     Status: Abnormal   Collection Time: 11/13/18  8:24 PM  Result Value Ref Range   WBC 7.8 4.0 - 10.5 K/uL   RBC 3.97 3.87 - 5.11 MIL/uL   Hemoglobin 12.1 12.0 - 15.0 g/dL   HCT 56.239.3 13.036.0 - 86.546.0 %   MCV 99.0 80.0 - 100.0 fL   MCH 30.5 26.0 - 34.0 pg   MCHC 30.8 30.0 - 36.0 g/dL   RDW 78.412.3 69.611.5 - 29.515.5 %   Platelets 113 (L) 150 - 400 K/uL    Comment: PLATELET COUNT CONFIRMED BY SMEAR SPECIMEN CHECKED FOR CLOTS Immature Platelet Fraction may be clinically indicated, consider ordering this additional test [id number]    nRBC 0.0 0.0 - 0.2 %   Neutrophils Relative %  57 %   Neutro Abs 4.5 1.7 - 7.7 K/uL   Lymphocytes Relative 31 %   Lymphs Abs 2.5 0.7 - 4.0 K/uL   Monocytes Relative 10 %   Monocytes Absolute 0.8 0.1 - 1.0 K/uL   Eosinophils Relative 1 %   Eosinophils Absolute 0.1 0.0 - 0.5 K/uL   Basophils Relative 0 %   Basophils Absolute 0.0 0.0 - 0.1 K/uL   WBC Morphology MILD LEFT SHIFT (1-5% METAS, OCC MYELO, OCC BANDS)     Comment: TOXIC GRANULATION   Immature Granulocytes 1 %   Abs Immature Granulocytes 0.05 0.00 - 0.07 K/uL    Comment: Performed at [facility], [address] [place], [place] [address]  Salicylate level     Status: None   Collection Time: 11/13/18  8:24 PM  Result Value Ref Range   Salicylate Lvl <7.0 2.8 - 30.0 mg/dL    Comment: Performed at [facility], [address] [place], [place] [address]  Acetaminophen level     Status: Abnormal   Collection Time: 11/13/18  8:24 PM  Result Value Ref Range   Acetaminophen (Tylenol), Serum <10 (L) 10 - 30 ug/mL     Comment: (NOTE) Therapeutic concentrations vary significantly. A range of 10-30 ug/mL  may be an effective concentration for many patients. However, some  are best treated at concentrations outside of this range. Acetaminophen concentrations >150 ug/mL at 4 hours after ingestion  and >50 ug/mL at 12 hours after ingestion are often associated with  toxic reactions. Performed at [facility], [address] [place], [place] [address]   SARS Coronavirus 2 (CEPHEID - Performed in [facility] hospital lab), Hosp Order     Status: None   Collection Time: 11/13/18 11:30 PM   Specimen: Nasopharyngeal Swab  Result Value Ref Range   SARS Coronavirus 2 NEGATIVE NEGATIVE    Comment: (NOTE) If result is NEGATIVE SARS-CoV-2 target nucleic acids are NOT DETECTED. The SARS-CoV-2 RNA is generally detectable in upper and lower  respiratory specimens during the acute phase of infection. The lowest  concentration of SARS-CoV-2 viral copies this assay can detect is 250  copies / mL. A negative result does not preclude SARS-CoV-2 infection  and should not be used as the sole basis for treatment or other  patient management decisions.  A negative result may occur with  improper specimen collection / handling, submission of specimen other  than nasopharyngeal swab, presence of viral mutation(s) within the  areas targeted by this assay, and inadequate number of viral copies  (<250 copies / mL). A negative result must be combined with clinical  observations, patient history, and epidemiological information. If result is POSITIVE SARS-CoV-2 target nucleic acids are DETECTED. The SARS-CoV-2 RNA is generally detectable in upper and lower  respiratory specimens dur ing the acute phase of infection.  Positive  results are indicative of active infection with SARS-CoV-2.  Clinical  correlation with patient history and other diagnostic information is  necessary to determine patient infection  status.  Positive results do  not rule out bacterial infection or co-infection with other viruses. If result is PRESUMPTIVE POSTIVE SARS-CoV-2 nucleic acids MAY BE PRESENT.   A presumptive positive result was obtained on the submitted specimen  and confirmed on repeat testing.  While 2019 novel coronavirus  (SARS-CoV-2) nucleic acids may be present in the submitted sample  additional confirmatory testing may be necessary for epidemiological  and / or clinical management purposes  to differentiate between  SARS-CoV-2 and other Sarbecovirus currently known to infect humans.  If clinically indicated additional testing with an alternate test  methodology [phone]) is advised. The SARS-CoV-2 RNA is generally  detectable in upper and lower respiratory sp ecimens during the acute  phase of infection. The expected result is Negative. Fact Sheet for Patients:  BoilerBrush.com.cy Fact Sheet for Healthcare Providers: https://pope.com/ This test is not yet approved or cleared by the [place] FDA and has been authorized for detection and/or diagnosis of SARS-CoV-2 by FDA under an Emergency Use Authorization (EUA).  This EUA will remain in effect (meaning this test can be used) for the duration of the COVID-19 declaration under Section 564(b)(1) of the Act, 21 U.S.C. section 360bbb-3(b)(1), unless the authorization is terminated or revoked sooner. Performed at [facility], [address], [place], [place] [address]   Urine rapid drug screen (hosp performed)     Status: Abnormal   Collection Time: 11/14/18  1:00 AM  Result Value Ref Range   Opiates NONE DETECTED NONE DETECTED   Cocaine NONE DETECTED NONE DETECTED   Benzodiazepines POSITIVE (A) NONE DETECTED   Amphetamines NONE DETECTED NONE DETECTED   Tetrahydrocannabinol NONE DETECTED NONE DETECTED   Barbiturates NONE DETECTED NONE DETECTED    Comment: (NOTE) DRUG SCREEN  FOR MEDICAL PURPOSES ONLY.  IF CONFIRMATION IS NEEDED FOR ANY PURPOSE, NOTIFY LAB WITHIN 5 DAYS. LOWEST DETECTABLE LIMITS FOR URINE DRUG SCREEN Drug Class                     Cutoff (ng/mL) Amphetamine and metabolites    1000 Barbiturate and metabolites    200 Benzodiazepine                 200 Tricyclics and metabolites     300 Opiates and metabolites        300 Cocaine and metabolites        300 THC                            50 Performed at [facility], [address], [place], [place] [address]   Urinalysis, Routine w reflex microscopic     Status: Abnormal   Collection Time: 11/14/18  1:00 AM  Result Value Ref Range   Color, Urine STRAW (A) YELLOW   APPearance CLEAR CLEAR   Specific Gravity, Urine 1.009 1.005 - 1.030   pH 7.0 5.0 - 8.0   Glucose, UA NEGATIVE NEGATIVE mg/dL   Hgb urine dipstick NEGATIVE NEGATIVE   Bilirubin Urine NEGATIVE NEGATIVE   Ketones, ur NEGATIVE NEGATIVE mg/dL   Protein, ur NEGATIVE NEGATIVE mg/dL   Nitrite NEGATIVE NEGATIVE   Leukocytes,Ua NEGATIVE NEGATIVE    Comment: Performed at [facility], [address], [place], [place] [address]  Glucose, capillary     Status: Abnormal   Collection Time: 11/14/18  7:36 AM  Result Value Ref Range   Glucose-Capillary 101 (H) 70 - 99 mg/dL   Comment 1 Notify RN   Comprehensive metabolic panel     Status: Abnormal   Collection Time: 11/14/18  7:44 AM  Result Value Ref Range   Sodium 138 135 - 145 mmol/L   Potassium 3.5 3.5 - 5.1 mmol/L   Chloride 109 98 - 111 mmol/L   CO2 19 (L) 22 - 32 mmol/L   Glucose, Bld 93 70 - 99 mg/dL   BUN 15 6 -  20 mg/dL   Creatinine, Ser 0.77 0.44 - 1.00 mg/dL   Calcium 8.9 8.9 - 10.3 mg/dL   Total Protein 7.3 6.5 - 8.1 g/dL   Albumin 3.8 3.5 - 5.0 g/dL   AST 13 (L) 15 - 41 U/L   ALT 19 0 - 44 U/L   Alkaline Phosphatase 66 38 - 126 U/L   Total Bilirubin 0.7 0.3 - 1.2 mg/dL   GFR calc non Af Amer >60 >60 mL/min   GFR calc Af Amer  >60 >60 mL/min   Anion gap 10 5 - 15    Comment: Performed at [facility], [address], [place], [place] [address]  Lactic acid, plasma     Status: None   Collection Time: 11/14/18  7:44 AM  Result Value Ref Range   Lactic Acid, Venous 0.7 0.5 - 1.9 mmol/L    Comment: Performed at [facility], [place] [address], [place], [place] [address]  Magnesium     Status: None   Collection Time: 11/14/18  7:44 AM  Result Value Ref Range   Magnesium 1.7 1.7 - 2.4 mg/dL    Comment: Performed at [facility], [place] [address] [place], [place] [address]  Basic metabolic panel     Status: Abnormal   Collection Time: 11/15/18  4:53 AM  Result Value Ref Range   Sodium 138 135 - 145 mmol/L   Potassium 3.0 (L) 3.5 - 5.1 mmol/L   Chloride 109 98 - 111 mmol/L   CO2 19 (L) 22 - 32 mmol/L   Glucose, Bld 79 70 - 99 mg/dL   BUN 11 6 - 20 mg/dL   Creatinine, Ser 0.97 0.44 - 1.00 mg/dL   Calcium 8.9 8.9 - 10.3 mg/dL   GFR calc non Af Amer >60 >60 mL/min   GFR calc Af Amer >60 >60 mL/min   Anion gap 10 5 - 15    Comment: Performed at [facility], [place] [address], [place], [place] [address]  CBC with Differential/Platelet     Status: Abnormal   Collection Time: 11/15/18  4:53 AM  Result Value Ref Range   WBC 9.8 4.0 - 10.5 K/uL   RBC 3.82 (L) 3.87 - 5.11 MIL/uL   Hemoglobin 11.8 (L) 12.0 - 15.0 g/dL   HCT 34.8 (L) 36.0 - 46.0 %   MCV 91.1 80.0 - 100.0 fL   MCH 30.9 26.0 - 34.0 pg   MCHC 33.9 30.0 - 36.0 g/dL   RDW 12.1 11.5 - 15.5 %   Platelets 206 150 - 400 K/uL   nRBC 0.0 0.0 - 0.2 %   Neutrophils Relative % 62 %   Neutro Abs 6.1 1.7 - 7.7 K/uL   Lymphocytes Relative 27 %   Lymphs Abs 2.6 0.7 - 4.0 K/uL   Monocytes Relative 10 %   Monocytes Absolute 1.0 0.1 - 1.0 K/uL   Eosinophils Relative 1 %   Eosinophils Absolute 0.1 0.0 - 0.5 K/uL   Basophils Relative 0 %   Basophils Absolute 0.0 0.0 - 0.1 K/uL   Immature Granulocytes 0 %    Abs Immature Granulocytes 0.04 0.00 - 0.07 K/uL    Comment: Performed at [place] [address], [place], [place] [address]  Magnesium     Status: None   Collection Time: 11/15/18  4:53 AM  Result Value Ref Range   Magnesium 1.9 1.7 - 2.4 mg/dL    Comment: Performed at [facility], [address], [place], [place] [address]    Recent Results (from the past 240 hour(s))  SARS Coronavirus 2 (CEPHEID - Performed in [facility] hospital lab), Hosp Order     Status: None   Collection Time: 11/13/18 11:30 PM   Specimen: Nasopharyngeal Swab  Result Value Ref Range Status   SARS Coronavirus 2 NEGATIVE NEGATIVE Final    Comment: (NOTE) If result is NEGATIVE SARS-CoV-2 target nucleic acids are NOT DETECTED. The SARS-CoV-2 RNA is generally detectable in upper and lower  respiratory specimens during the acute phase of infection. The lowest  concentration of SARS-CoV-2 viral copies this assay can detect is 250  copies / mL. A negative result does not preclude SARS-CoV-2 infection  and should not be used as the sole basis for treatment or other  patient management decisions.  A negative result may occur with  improper specimen collection / handling, submission of specimen other  than nasopharyngeal swab, presence of viral mutation(s) within the  areas targeted by this assay, and inadequate number of viral copies  (<250 copies / mL). A negative result must be combined with clinical  observations, patient history, and epidemiological information. If result is POSITIVE SARS-CoV-2 target nucleic acids are DETECTED. The SARS-CoV-2 RNA is generally detectable in upper and lower  respiratory specimens dur ing the acute phase of infection.  Positive  results are indicative of active infection with SARS-CoV-2.  Clinical  correlation with patient history and other diagnostic information is  necessary to determine patient infection status.  Positive results do  not rule out bacterial  infection or co-infection with other viruses. If result is PRESUMPTIVE POSTIVE SARS-CoV-2 nucleic acids MAY BE PRESENT.   A presumptive positive result was obtained on the submitted specimen  and confirmed on repeat testing.  While 2019 novel coronavirus  (SARS-CoV-2) nucleic acids may be present in the submitted sample  additional confirmatory testing may be necessary for epidemiological  and / or clinical management purposes  to differentiate between  SARS-CoV-2 and other Sarbecovirus currently known to infect humans.  If clinically indicated additional testing with an alternate test  methodology [phone]) is advised. The SARS-CoV-2 RNA is generally  detectable in upper and lower respiratory sp ecimens during the acute  phase of infection. The expected result is Negative. Fact Sheet for Patients:  BoilerBrush.com.cy Fact Sheet for Healthcare Providers: https://pope.com/ This test is not yet approved or cleared by the [place] FDA and has been authorized for detection and/or diagnosis of SARS-CoV-2 by FDA under an Emergency Use Authorization (EUA).  This EUA will remain in effect (meaning this test can be used) for the duration of the COVID-19 declaration under Section 564(b)(1) of the Act, 21 U.S.C. section 360bbb-3(b)(1), unless the authorization is terminated or revoked sooner. Performed at [facility], [address], [place], [place] [address]     Lipid Panel No results for input(s): CHOL, TRIG, HDL, CHOLHDL, VLDL, LDLCALC in the last 72 hours.  Studies/Results: Dg Chest 1 View  Result Date: 11/14/2018 CLINICAL DATA:  Seizure EXAM: CHEST  1 VIEW COMPARISON:  Portable exam 2348 hours compared to 10/21/2018 FINDINGS: Upper normal size of cardiac silhouette. Mediastinal contours and pulmonary vascularity normal. Low lung volumes. No definite acute infiltrate, pleural effusion or pneumothorax. Bones  demineralized. IMPRESSION: No definite acute infiltrates. Electronically Signed   By: Ulyses Southward M.D.   On: 11/14/2018 00:01   Ct Head Wo Contrast  Result Date: 11/14/2018 CLINICAL DATA:  Seizure activity.  [name] EXAM: CT HEAD WITHOUT CONTRAST TECHNIQUE: Contiguous axial images were obtained from the base of the skull through the vertex without intravenous contrast. COMPARISON:  None. FINDINGS: Brain: There is no mass, hemorrhage or extra-axial collection. The ventricles are decreased in size relative to the prior study. Gray-white differentiation is maintained. Vascular: No abnormal hyperdensity of the major intracranial arteries or dural venous sinuses. No intracranial atherosclerosis. Skull: The visualized skull base, calvarium and extracranial soft tissues are normal. Sinuses/Orbits: The left frontal sinus, left maxillary sinus and left anterior ethmoid air cells are opacified. No mastoid or middle ear effusion. The orbits are normal. IMPRESSION: No acute intracranial abnormality. Chronic left ostiomeatal complex sinus disease. Electronically Signed   By: Deatra Robinson M.D.   On: 11/14/2018 00:24    Medications:  Scheduled: . busPIRone  10 mg Oral TID  . cholecalciferol  2,000 Units Oral Daily  . clonazePAM  1 mg Oral BID  . enoxaparin (LOVENOX) injection  40 mg Subcutaneous Daily  . guanFACINE  2 mg Oral Daily  . metoprolol tartrate  12.5 mg Oral Daily  . naltrexone  25 mg Oral BID  . pantoprazole  40 mg Oral Daily  . polyethylene glycol  17 g Oral 3 times weekly  . potassium chloride  40 mEq Oral Once  . sertraline  100 mg Oral Daily  . sodium chloride flush  3 mL Intravenous Q12H  . topiramate  50 mg Oral BID  . vitamin C  500 mg Oral Daily  . ziprasidone  60 mg Oral Daily   Continuous: . sodium chloride Stopped (11/14/18 1330)  . 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 She had a first time seizure on 6/27 after medication change with sudden withdrawal of VPA and clonazepam 8 days previously. Mom says she noted both staring spells as well as odd arm movements/behaviors in her sleep and nightmares (seen on the bedside video monitor at home). All of which started since medications were stopped on 6/19. She had a GTC seizure at home on 6/27 and additional seizures en route as well as in the ED. 1. Seizures most likely secondary to abrupt withdrawal of clonazepam, with discontinuation of VPA possibly also playing a role.  2. Also may have underlying epilepsy. Per GainPain.com.cy [place] [employer], this genetic syndrome does often have associated seizures (~20%). It could be that this was present but did not manifest since she has been on a regimen for mood and behavior control, which also has anticonvulsant activity, for approximately the past 8 years: VPA 750 mg BID, clonazepam 1 mg BID and topiramate 50 mg BID. 3. Clonazepam has been restarted. No further seizure activity. Continue Topamax. Can continue off valproic acid unless electrographic seizure activity or intermittent epileptiform discharges are seen on LTM EEG - if patient has intermittent epileptiform discharges, then there would be increased suspicion for underlying seizure disorder and she would likely then need to be restarted her Depakote. 4. If EEG is negative for electrographic seizure activity, then patient should be discharged on clonazepam. A slow taper could then be initiated by her psychiatrist if indicated from a psychiatric standpoint, but likely would also need to be prescribed with PRN Diastat rectal gel for use if seizure should reoccur. Also, if seizures recur, she should be immediately re-evaluated in the ED.      LOS:  1 day   @Electronically  signed: Dr. Caryl PinaEric Cathlene Gardella@ 11/15/2018  8:35 AM

## 2018-11-15 NOTE — Progress Notes (Signed)
PROGRESS NOTE    Dana Mcneil  GNF:[id number] DOB: 02-01-1991 DOA: 11/13/2018 PCP: Jac Canavanysinger, David S, PA-C  Brief Narrative: 28 y.o. 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  Starting this afternoon.  the first seizure lasted about 45 seconds.  The second seizure occurred while she was going to get something from the refrigerator and lasted minutes.  Pt had a 3rd seizure en route to ER, and then a 4th brief seizure while in ED.  Pt bit her lip, and wet her diaper.    Pt's family notes that she her clonazepam and depakote were stopped a week ago, and this may have been a precipitating factor.  No recent head trauma.  No prior history of seizure disorder.   In ED,  T 98.1, P 97  R 26, Bp 126/93  Pox 100% Wt 75.8kg  Wbc 7.8, Hgb 12.1, Plt 113 Na 140, K 3.4 Bun 17, Creatinine 0.95 Glucose 106 Hco3 14 AG 16  Per ED, neurology said that the timing of the seizure is classic for benzo/ depakote withdrawal and to start clonazepam at 0.5mg  po tid  Pt will be admitted for seizure.     11/15/2018 mother beside her reports no seizures overnight or this morning she is slowly coming back to her baseline she said hello to me and said he is she is doing okay when asked how are you good eye contact.  Assessment & Plan:   Principal Problem:   Seizure (HCC) Active Problems:   Generalized anxiety disorder   Esophageal reflux   Intellectual disability   Irritability and anger   Aggression   PTSD (post-traumatic stress disorder)   Congenital abnormalities   [name]   Bipolar 1 disorder (HCC)    #1 new onset seizure in a patient with developmental delay and Cornelia de Lang syndrome.  Patient known to me from previous admission few weeks ago.  Per mother she never had seizures.  She was on multiple medications including Topamax valproic acid and clonazepam.   Valproic acid and clonazepam was stopped 8 days prior to admission to hospital as patient had increased behaviors on it.  The seizures were thought to be due to sudden withdrawal of the above medications although patient might have been more prone to seizures with her syndrome or possibility of having had seizures with this syndrome which has been Masked  due to the fact she was on anti-epileptic agents for other reasons including mood and behavior.  Neurology following.  Topiramate to be continued her clonazepam restarted valproic acid still on hold however if patient does have seizures neurology recommends to restart valproic acid.  Continue EEG monitoring. Mother should be allowed to stay with the patient in spite of covid restrictions.  #2 anxiety/PTSD on Geodon and Zoloft and Topamax.  Continue BuSpar.  Continue clonazepam.  #3 chronic tachycardia was started on metoprolol 12.5 daily  DVT prophylaxis: Lovenox Code Status: Full Family Communication: Updated mother at bedside. Disposition Plan pending further seizure work-up Antimicrobials none  Consultants:   Neuro hospitalist: Pending spoke with Dr. Laurence SlateAroor   Estimated body mass index is 40.27 kg/m as calculated from the following:   Height as of this encounter: 4\' 6"  (1.372 m).   Weight as of this encounter: 75.8 kg.   Subjective: Resting in bed awake slowly coming back to baseline mother by the bedside  Objective: Vitals:  11/14/18 1619 11/14/18 2019 11/15/18 0438 11/15/18 0834  BP: (!) 155/71 111/63 97/68 120/82  Pulse: 70 71 88 81  Resp: 16 17 20 18   Temp: 99.3 F (37.4 C)  98.6 F (37 C) 97.7 F (36.5 C)  TempSrc: Oral  Oral Oral  SpO2: 100% 99% 100% 98%  Weight:      Height:        Intake/Output Summary (Last 24 hours) at 11/15/2018 1031 Last data filed at 11/14/2018 1652 Gross per 24 hour  Intake 321.64 ml  Output --  Net 321.64 ml   Filed Weights   11/13/18 2017  Weight: 75.8 kg    Examination: EEG  monitoring in progress.  General exam: Appears calm and comfortable  Respiratory system: Clear to auscultation. Respiratory effort normal. Cardiovascular system: S1 & S2 heard, RRR. No JVD, murmurs, rubs, gallops or clicks. No pedal edema. Gastrointestinal system: Abdomen is nondistended, soft and nontender. No organomegaly or masses felt. Normal bowel sounds heard. Central nervous system: Awake answering some questions appropriately No focal neurological deficits. Extremities: Symmetric 5 x 5 power. Skin: No rashes, lesions or ulcers    Data Reviewed: I have personally reviewed following labs and imaging studies  CBC: Recent Labs  Lab 11/13/18 2024 11/15/18 0453  WBC 7.8 9.8  NEUTROABS 4.5 6.1  HGB 12.1 11.8*  HCT 39.3 34.8*  MCV 99.0 91.1  PLT 113* 951   Basic Metabolic Panel: Recent Labs  Lab 11/13/18 2024 11/14/18 0744 11/15/18 0453  NA 140 138 138  K 3.4* 3.5 3.0*  CL 110 109 109  CO2 14* 19* 19*  GLUCOSE 106* 93 79  BUN 17 15 11   CREATININE 0.95 0.77 0.97  CALCIUM 8.8* 8.9 8.9  MG  --  1.7 1.9   GFR: Estimated Creatinine Clearance: 66.6 mL/min (by C-G formula based on SCr of 0.97 mg/dL). Liver Function Tests: Recent Labs  Lab 11/13/18 2024 11/14/18 0744  AST 21 13*  ALT 21 19  ALKPHOS 70 66  BILITOT 0.4 0.7  PROT 7.9 7.3  ALBUMIN 4.5 3.8   No results for input(s): LIPASE, AMYLASE in the last 168 hours. No results for input(s): AMMONIA in the last 168 hours. Coagulation Profile: No results for input(s): INR, PROTIME in the last 168 hours. Cardiac Enzymes: No results for input(s): CKTOTAL, CKMB, CKMBINDEX, TROPONINI in the last 168 hours. BNP (last 3 results) No results for input(s): PROBNP in the last 8760 hours. HbA1C: No results for input(s): HGBA1C in the last 72 hours. CBG: Recent Labs  Lab 11/14/18 0736  GLUCAP 101*   Lipid Profile: No results for input(s): CHOL, HDL, LDLCALC, TRIG, CHOLHDL, LDLDIRECT in the last 72 hours. Thyroid  Function Tests: No results for input(s): TSH, T4TOTAL, FREET4, T3FREE, THYROIDAB in the last 72 hours. Anemia Panel: No results for input(s): VITAMINB12, FOLATE, FERRITIN, TIBC, IRON, RETICCTPCT in the last 72 hours. Sepsis Labs: Recent Labs  Lab 11/14/18 0744  LATICACIDVEN 0.7    Recent Results (from the past 240 hour(s))  SARS Coronavirus 2 (CEPHEID - Performed in [facility] hospital lab), Hosp Order     Status: None   Collection Time: 11/13/18 11:30 PM   Specimen: Nasopharyngeal Swab  Result Value Ref Range Status   SARS Coronavirus 2 NEGATIVE NEGATIVE Final    Comment: (NOTE) If result is NEGATIVE SARS-CoV-2 target nucleic acids are NOT DETECTED. The SARS-CoV-2 RNA is generally detectable in upper and lower  respiratory specimens during the acute phase of infection. The lowest  concentration of SARS-CoV-2 viral copies this assay can detect is 250  copies / mL. A negative result does not preclude SARS-CoV-2 infection  and should not be used as the sole basis for treatment or other  patient management decisions.  A negative result may occur with  improper specimen collection / handling, submission of specimen other  than nasopharyngeal swab, presence of viral mutation(s) within the  areas targeted by this assay, and inadequate number of viral copies  (<250 copies / mL). A negative result must be combined with clinical  observations, patient history, and epidemiological information. If result is POSITIVE SARS-CoV-2 target nucleic acids are DETECTED. The SARS-CoV-2 RNA is generally detectable in upper and lower  respiratory specimens dur ing the acute phase of infection.  Positive  results are indicative of active infection with SARS-CoV-2.  Clinical  correlation with patient history and other diagnostic information is  necessary to determine patient infection status.  Positive results do  not rule out bacterial infection or co-infection with other viruses. If result is  PRESUMPTIVE POSTIVE SARS-CoV-2 nucleic acids MAY BE PRESENT.   A presumptive positive result was obtained on the submitted specimen  and confirmed on repeat testing.  While 2019 novel coronavirus  (SARS-CoV-2) nucleic acids may be present in the submitted sample  additional confirmatory testing may be necessary for epidemiological  and / or clinical management purposes  to differentiate between  SARS-CoV-2 and other Sarbecovirus currently known to infect humans.  If clinically indicated additional testing with an alternate test  methodology [phone]) is advised. The SARS-CoV-2 RNA is generally  detectable in upper and lower respiratory sp ecimens during the acute  phase of infection. The expected result is Negative. Fact Sheet for Patients:  BoilerBrush.com.cyhttps://www.fda.gov/media/136312/download Fact Sheet for Healthcare Providers: https://pope.com/https://www.fda.gov/media/136313/download This test is not yet approved or cleared by the [place] FDA and has been authorized for detection and/or diagnosis of SARS-CoV-2 by FDA under an Emergency Use Authorization (EUA).  This EUA will remain in effect (meaning this test can be used) for the duration of the COVID-19 declaration under Section 564(b)(1) of the Act, 21 U.S.C. section 360bbb-3(b)(1), unless the authorization is terminated or revoked sooner. Performed at [facility], [address] [place], [place] [address]          Radiology Studies: Dg Chest 1 View  Result Date: 11/14/2018 CLINICAL DATA:  Seizure EXAM: CHEST  1 VIEW COMPARISON:  Portable exam 2348 hours compared to 10/21/2018 FINDINGS: Upper normal size of cardiac silhouette. Mediastinal contours and pulmonary vascularity normal. Low lung volumes. No definite acute infiltrate, pleural effusion or pneumothorax. Bones demineralized. IMPRESSION: No definite acute infiltrates. Electronically Signed   By: Ulyses SouthwardMark  Boles M.D.   On: 11/14/2018 00:01   Ct Head Wo Contrast  Result  Date: 11/14/2018 CLINICAL DATA:  Seizure activity.  [name] EXAM: CT HEAD WITHOUT CONTRAST TECHNIQUE: Contiguous axial images were obtained from the base of the skull through the vertex without intravenous contrast. COMPARISON:  None. FINDINGS: Brain: There is no mass, hemorrhage or extra-axial collection. The ventricles are decreased in size relative to the prior study. Gray-white differentiation is maintained. Vascular: No abnormal hyperdensity of the major intracranial arteries or dural venous sinuses. No intracranial atherosclerosis. Skull: The visualized skull base, calvarium and extracranial soft tissues are normal. Sinuses/Orbits: The left frontal sinus, left maxillary sinus and left anterior ethmoid air cells are opacified. No mastoid or middle ear effusion. The orbits are normal. IMPRESSION: No acute intracranial abnormality. Chronic  left ostiomeatal complex sinus disease. Electronically Signed   By: Deatra RobinsonKevin  Herman M.D.   On: 11/14/2018 00:24        Scheduled Meds:  busPIRone  10 mg Oral TID   cholecalciferol  2,000 Units Oral Daily   clonazePAM  1 mg Oral BID   enoxaparin (LOVENOX) injection  40 mg Subcutaneous Daily   guanFACINE  2 mg Oral Daily   metoprolol tartrate  12.5 mg Oral Daily   naltrexone  25 mg Oral BID   pantoprazole  40 mg Oral Daily   polyethylene glycol  17 g Oral 3 times weekly   sertraline  100 mg Oral Daily   sodium chloride flush  3 mL Intravenous Q12H   topiramate  50 mg Oral BID   vitamin C  500 mg Oral Daily   ziprasidone  60 mg Oral Daily   Continuous Infusions:  sodium chloride Stopped (11/14/18 1330)   sodium chloride 75 mL/hr at 11/14/18 1337     LOS: 1 day     Alwyn RenElizabeth G Selen Smucker, MD [facility]  If 7PM-7AM, please contact night-coverage [email or web address] Password [email or web address] 11/15/2018, 10:31 AM

## 2018-11-15 NOTE — Progress Notes (Addendum)
1645 Pt is calm, cooperative, pleasant. Cont EEG is on. Pt's mother thought EEG should be done and complete by this time. I tried to call EEG dept, left a message but no call back. Will ff-up in AM. 1920 K+ 3.0 message sent [facility] on call Dr Myna Hidalgo.

## 2018-11-15 NOTE — Evaluation (Signed)
Clinical/Bedside Swallow Evaluation Patient Details  Name: Dana Mcneil MRN: [id number] Date of Birth: May 27, 1990  Today's Date: 11/15/2018 Time: SLP Start Time (ACUTE ONLY): 1005 SLP Stop Time (ACUTE ONLY): 1020 SLP Time Calculation (min) (ACUTE ONLY): 15 min  Past Medical History:  Past Medical History:  Diagnosis Date  . Bipolar 1 disorder (HCC) 11/14/2018  . [name]   . MR (mental retardation)    Past Surgical History: History reviewed. No pertinent surgical history. HPI:  Pt is a 28 y.o. female with hx of cornelia de lange syndrome, generalized anxiety, PTSD, MR, GERD and dysphagia, admitted with first-time seizure after medication change.  Pt has undergone MBS studies in the past, the last on March 22, 2018 after reports of coughing with thin liquids.  (Pt also seen  05/02/2014 for MBS - rec D3/nectar, repeat MBS completed 03/27/2015 - rec D2/nectar. Also participated in outpatient SLP services 05/31/2014-06/28/2014.) During last MBS, pt exhibited "mild oropharyngeal dysphagia marked by oral residue and reduced manipulation/mastication of solids as well as silent aspiration with thin without implementation of chin tuck maneuver due to incoordination."  Her mother reports today that pt consistently uses chin tuck and has generally been doing well on a regular diet with thin liquids.    Assessment / Plan / Recommendation Clinical Impression  Pt is alert, answering questions verbally, eager to eat.  Her mother is at bedside and gives detailed hx re: swallowing.  Pt uses a chin tuck for airway protection with thin liquids and executes precautions independently.  She followed commands for oral mechanism exam.  Demonstrates adequate toleration of all tested POs, including thin liquids, without concerns for aspiration.  She is able to feed herself.  Recommend resuming a regular diet, thin liquids; continue chin tuck.  No SLP f/u warranted. No speech/language eval warranted, pt  is approaching baseline.   SLP Visit Diagnosis: Dysphagia, oropharyngeal phase (R13.12)    Aspiration Risk  Mild aspiration risk    Diet Recommendation   regular, thin liquids  Medication Administration: Whole meds with liquid    Other  Recommendations Oral Care Recommendations: Oral care BID   Follow up Recommendations None      Frequency and Duration            Prognosis        Swallow Study   General HPI: Pt is a 28 y.o. female with hx of cornelia de lange syndrome, generalized anxiety, PTSD, MR, GERD and dysphagia, admitted with first-time seizure after medication change.  Pt has undergone MBS studies in the past, the last on March 22, 2018 after reports of coughing with thin liquids.  (Pt also seen  05/02/2014 for MBS - rec D3/nectar, repeat MBS completed 03/27/2015 - rec D2/nectar. Also participated in outpatient SLP services 05/31/2014-06/28/2014.) During last MBS, pt exhibited "mild oropharyngeal dysphagia marked by oral residue and reduced manipulation/mastication of solids as well as silent aspiration with thin without implementation of chin tuck maneuver due to incoordination."  Her mother reports today that pt consistently uses chin tuck and has generally been doing well on a regular diet with thin liquids.  Type of Study: Bedside Swallow Evaluation Previous Swallow Assessment: see HPI Diet Prior to this Study: NPO Temperature Spikes Noted: No Respiratory Status: Room air History of Recent Intubation: No Behavior/Cognition: Alert;Cooperative;Pleasant mood Oral Cavity Assessment: Within Functional Limits Oral Care Completed by SLP: No Oral Cavity - Dentition: Adequate natural dentition Vision: Functional for self-feeding Self-Feeding Abilities: Able to feed self Patient  Positioning: Upright in bed Baseline Vocal Quality: Other (comment)(hypernasal resonance) Volitional Cough: Strong Volitional Swallow: Able to elicit    Oral/Motor/Sensory Function     Ice Chips  Ice chips: Within functional limits   Thin Liquid Thin Liquid: Within functional limits    Nectar Thick Nectar Thick Liquid: Not tested   Honey Thick Honey Thick Liquid: Not tested   Puree Puree: Within functional limits   Solid     Solid: Within functional limits      Dana Mcneil 11/15/2018,10:35 AM   Dana Mcneil, Dana Mcneil Office number [phone] Pager [phone]

## 2018-11-16 LAB — BASIC METABOLIC PANEL
Anion gap: 9 (ref 5–15)
BUN: 10 mg/dL (ref 6–20)
CO2: 18 mmol/L — ABNORMAL LOW (ref 22–32)
Calcium: 8.7 mg/dL — ABNORMAL LOW (ref 8.9–10.3)
Chloride: 110 mmol/L (ref 98–111)
Creatinine, Ser: 0.75 mg/dL (ref 0.44–1.00)
GFR calc Af Amer: >60 mL/min (ref >60)
GFR calc non Af Amer: >60 mL/min (ref >60)
Glucose, Bld: 90 mg/dL (ref 70–99)
Potassium: 4 mmol/L (ref 3.5–5.1)
Sodium: 137 mmol/L (ref 135–145)

## 2018-11-16 LAB — MAGNESIUM: Magnesium: 1.6 mg/dL — ABNORMAL LOW (ref 1.7–2.4)

## 2018-11-16 MED ORDER — CLONAZEPAM 1 MG PO TABS: 1.0000 mg | ORAL_TABLET | Freq: Two times a day (BID) | ORAL | 0 refills | Status: DC

## 2018-11-16 MED ORDER — DIAZEPAM 2.5 MG RE GEL: 2.5000 mg | Freq: Once | RECTAL | 2 refills | Status: DC

## 2018-11-16 MED ORDER — ACETAMINOPHEN 325 MG PO TABS: 650.0000 mg | ORAL_TABLET | Freq: Four times a day (QID) | ORAL | Status: DC | PRN

## 2018-11-16 NOTE — Plan of Care (Signed)
  Problem: Education: Goal: Knowledge of General Education information will improve Description: Including pain rating scale, medication(s)/side effects and non-pharmacologic comfort measures Outcome: Progressing   Problem: Health Behavior/Discharge Planning: Goal: Ability to manage health-related needs will improve Outcome: Progressing   Problem: Clinical Measurements: Goal: Ability to maintain clinical measurements within normal limits will improve Outcome: Progressing Goal: Will remain free from infection Outcome: Progressing Goal: Diagnostic test results will improve Outcome: Progressing Goal: Respiratory complications will improve Outcome: Progressing Goal: Cardiovascular complication will be avoided Outcome: Progressing   Problem: Activity: Goal: Risk for activity intolerance will decrease Outcome: Progressing   Problem: Nutrition: Goal: Adequate nutrition will be maintained Outcome: Progressing   Problem: Coping: Goal: Level of anxiety will decrease Outcome: Progressing   Problem: Elimination: Goal: Will not experience complications related to bowel motility Outcome: Progressing Goal: Will not experience complications related to urinary retention Outcome: Progressing   Problem: Pain Managment: Goal: General experience of comfort will improve Outcome: Progressing   Problem: Safety: Goal: Ability to remain free from injury will improve Outcome: Progressing   Problem: Skin Integrity: Goal: Risk for impaired skin integrity will decrease Outcome: Progressing   Problem: Education: Goal: Expressions of having a comfortable level of knowledge regarding the disease process will increase Outcome: Progressing   Problem: Coping: Goal: Ability to adjust to condition or change in health will improve Outcome: Progressing Goal: Ability to identify appropriate support needs will improve Outcome: Progressing   Problem: Health Behavior/Discharge Planning: Goal:  Compliance with prescribed medication regimen will improve Outcome: Progressing   Problem: Medication: Goal: Risk for medication side effects will decrease Outcome: Progressing   Problem: Safety: Goal: Verbalization of understanding the information provided will improve Outcome: Progressing   Ival Bible, BSN, RN

## 2018-11-16 NOTE — Progress Notes (Signed)
LTM discontinued;  No skin breakdown 

## 2018-11-16 NOTE — Progress Notes (Signed)
S/O: LTM EEG report conclusions from yesterday: This continuous EEG monitoring with simultaneous video monitoring did not record any clinical subclinical seizures.  Background activities were essentially normal throughout the recording.  Pushbutton activation events were not seizures.  BP 106/68 (BP Location: Left Arm)   Pulse 66   Temp 98.6 F (37 C) (Oral)   Resp 18   Ht 4\' 6"  (1.372 m)   Wt 75.9 kg   SpO2 100%   BMI 40.34 kg/m   HEENT: Microcephalic. Dysmorphic facial features noted. EEG leads in place.  Lungs: Respirations unlabored  Neurological exam:    Ment: Bright affect. Able to answer simple questions and follow basic motor commands. CN: Fixates normally. Face symmetric.  Motor: Moves all 4 extremities symmetrically to command.  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 1. Discontinuing LTM EEG 2. Continue clonazepam and discharge home on this medication. A slow taper could then be initiated by her psychiatrist if indicated from a psychiatric standpoint, but likely would also need to be prescribed with PRN Diastat rectal gel for use if seizure should reoccur. Also, if seizures recur, she should be immediately re-evaluated in the ED.   Electronically signed: Dr. Kerney Elbe

## 2018-11-16 NOTE — TOC Transition Note (Addendum)
Transition of Care [facility]) - CM/SW Discharge Note   Patient Details  Name: Dana Mcneil MRN: [id number] Date of Birth: [birth year]  [facility] CM/SW Contact:  Geralynn Ochs, LCSW Phone Number: 11/16/2018, 11:30 AM   Clinical Narrative:  CSW met with patient's mother at bedside to discuss discharge needs. Per mother, they have aide and OT set up through their own private service and have no discharge needs at this time. CSW asked about previously set up PT through [facility], and mother said that she didn't need it so they had already canceled it.     Final next level of care: Home/Self Care Barriers to Discharge: No Barriers Identified   Patient Goals and CMS Choice Patient states their goals for this hospitalization and ongoing recovery are:: to go back home      Discharge Placement                       Discharge Plan and Services     Post Acute Care Choice: NA                               Social Determinants of Health (SDOH) Interventions     Readmission Risk Interventions No flowsheet data found.

## 2018-11-16 NOTE — Procedures (Signed)
Electroencephalogram report.  Long-term monitoring.  CPT [id number]  Recording begins 11/15/2018 at 0730 Recording ends 11/16/2018 at 0730  Day 2   The requisition: [employer] 10-20 for electrode placement.  18 channels EEG with additional eyes linked to ipsilateral ears and EKG.  Additional T1-T2 electrodes were also used.  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  Day 2: Several pushbutton activation events were present around 07/30/2021, 1630 midnight and 1 AM.  Reason for pushbutton unclear possibly due tor accidents.  However there was no EEG changes prior during or after these events of interest.  These were not seizures.  Background activities marked by continuous background activities with reactivity and stage changes.  During wakefulness background activities marked by attenuated low amplitude 10 to 12 cps posterior dominant waking rhythm.  Admixed faster frequencies in the beta range were prominent activity throughout the recording.  Patient had normal drowsiness and sleep architecture.  There was no clinical or subclinical seizures.  No interhemispheric asymmetries.  No focal abnormalities.  No interictal epileptiform discharges present.  Day 2: Background activities were unchanged from previously recorded marked by predominantly fast frequencies in the beta range. Several pushbutton activation events were present throughout the night activated by patient mother in response to arousals and spontaneous movements.  These episodes most consistent with arousals traction and fidgetiness.  Electrographically was marked by insurer eruption of the sleep pattern and arousals with emergence of waking background.   Clinical interpretation: This continuous EEG monitoring with simultaneous video monitoring was abnormal and consistent with known static encephalopathy.  Several pushbutton activation events due to arousals were recorded and most consistent  with normal arousal responses.  Frontal lobe seizures are less likely.  If concern for seizures persist continuous monitoring is recommended.  Clinical correlation is advised

## 2018-11-16 NOTE — Discharge Summary (Signed)
PROGRESS NOTE    Dana Mcneil  ZOX:[id number] DOB: [birth year] DOA: 11/13/2018 PCP: Jac Canavanysinger, David S, PA-C   Brief Narrative:  28 y.o.female,w intellectual disability (cornelia de lange syndrome), w recent admission for CAP/ acute renal failure on 10/21/18, who presents with seizurex4. Starting this afternoon. the first seizure lasted about 45 seconds. The second seizure occurred while she was going to get something from the refrigerator and lasted minutes. Pt had a 3rd seizure en route to ER, and then a 4th brief seizure while in ED. Pt bit her lip, and wet her diaper.   Pt's family notes that she her clonazepam and depakote were stopped a week ago, and this may have been a precipitating factor. No recent head trauma. No prior history of seizure disorder.   In ED,  T 98.1, P 97 R 26, Bp 126/93 Pox 100% Wt 75.8kg  Wbc 7.8, Hgb 12.1, Plt 113 Na 140, K 3.4 Bun 17, Creatinine 0.95 Glucose 106 Hco3 14 AG 16  Per ED, neurology said that the timing of the seizure is classic for benzo/ depakote withdrawal and to start clonazepam at 0.5mg  po tid And admitted for diagnosis of new onset seizures  Assessment & Plan:   Principal Problem:   Seizure (HCC) Active Problems:   Generalized anxiety disorder   Esophageal reflux   Intellectual disability   Irritability and anger   Aggression   PTSD (post-traumatic stress disorder)   Congenital abnormalities   [name]   Bipolar 1 disorder (HCC)  #1 new onset seizure in a patient with developmental delay and Cornelia de Lang syndrome.  She was evaluated with continuous EEG and video monitoring for 48 hours.  EEG report shows did not record any clinical or subclinical seizures.  Background activities were essentially normal throughout the recording.  Dr. Cena BentonLinson recommended good to continue clonazepam upon discharge and to try a slow taper if needed by the psychiatrist as an outpatient.  He also recommended Diastat  rectal gel for seizures if they do occur at home and if seizures recur she should immediately come to the hospital.   #2 anxiety/PTSD on Geodon and Zoloft and Topamax.  Continue BuSpar.  Continue clonazepam.  #3 chronic tachycardia was started on metoprolol 12.5 daily  Estimated body mass index is 40.34 kg/m as calculated from the following:   Height as of this encounter: 4\' 6"  (1.372 m).   Weight as of this encounter: 75.9 kg.    Subjective: Patient has had no further seizures for the last 24 hours.  Mother by the bedside she is awake alert oriented   Objective: Vitals:   11/15/18 2016 11/16/18 0033 11/16/18 0403 11/16/18 0500  BP: 99/64 132/74 106/68   Pulse: 84 72 66   Resp: 18 16 18    Temp: (!) 97.5 F (36.4 C) 98 F (36.7 C) 98.6 F (37 C)   TempSrc: Oral Oral Oral   SpO2: 100% 100% 100%   Weight:    75.9 kg  Height:        Intake/Output Summary (Last 24 hours) at 11/16/2018 1031 Last data filed at 11/16/2018 0424 Gross per 24 hour  Intake 1765 ml  Output -  Net 1765 ml   Filed Weights   11/13/18 2017 11/16/18 0500  Weight: 75.8 kg 75.9 kg    Examination:  General exam: Appears calm and comfortable  Respiratory system: Clear to auscultation. Respiratory effort normal. Cardiovascular system: S1 & S2 heard, RRR. No JVD, murmurs, rubs, gallops or  clicks. No pedal edema. Gastrointestinal system: Abdomen is nondistended, soft and nontender. No organomegaly or masses felt. Normal bowel sounds heard. Central nervous system: Alert and oriented. No focal neurological deficits. Extremities: Moves all extremities  skin: No rashes, lesions or ulcers    Data Reviewed: I have personally reviewed following labs and imaging studies  CBC: Recent Labs  Lab 11/13/18 2024 11/15/18 0453  WBC 7.8 9.8  NEUTROABS 4.5 6.1  HGB 12.1 11.8*  HCT 39.3 34.8*  MCV 99.0 91.1  PLT 113* 376   Basic Metabolic Panel: Recent Labs  Lab 11/13/18 2024 11/14/18 0744 11/15/18  0453 11/16/18 0743  NA 140 138 138 137  K 3.4* 3.5 3.0* 4.0  CL 110 109 109 110  CO2 14* 19* 19* 18*  GLUCOSE 106* 93 79 90  BUN 17 15 11 10   CREATININE 0.95 0.77 0.97 0.75  CALCIUM 8.8* 8.9 8.9 8.7*  MG  --  1.7 1.9 1.6*   GFR: Estimated Creatinine Clearance: 80.9 mL/min (by C-G formula based on SCr of 0.75 mg/dL). Liver Function Tests: Recent Labs  Lab 11/13/18 2024 11/14/18 0744  AST 21 13*  ALT 21 19  ALKPHOS 70 66  BILITOT 0.4 0.7  PROT 7.9 7.3  ALBUMIN 4.5 3.8   No results for input(s): LIPASE, AMYLASE in the last 168 hours. No results for input(s): AMMONIA in the last 168 hours. Coagulation Profile: No results for input(s): INR, PROTIME in the last 168 hours. Cardiac Enzymes: No results for input(s): CKTOTAL, CKMB, CKMBINDEX, TROPONINI in the last 168 hours. BNP (last 3 results) No results for input(s): PROBNP in the last 8760 hours. HbA1C: No results for input(s): HGBA1C in the last 72 hours. CBG: Recent Labs  Lab 11/14/18 0736  GLUCAP 101*   Lipid Profile: No results for input(s): CHOL, HDL, LDLCALC, TRIG, CHOLHDL, LDLDIRECT in the last 72 hours. Thyroid Function Tests: No results for input(s): TSH, T4TOTAL, FREET4, T3FREE, THYROIDAB in the last 72 hours. Anemia Panel: No results for input(s): VITAMINB12, FOLATE, FERRITIN, TIBC, IRON, RETICCTPCT in the last 72 hours. Sepsis Labs: Recent Labs  Lab 11/14/18 0744  LATICACIDVEN 0.7    Recent Results (from the past 240 hour(s))  SARS Coronavirus 2 (CEPHEID - Performed in [facility] hospital lab), Hosp Order     Status: None   Collection Time: 11/13/18 11:30 PM   Specimen: Nasopharyngeal Swab  Result Value Ref Range Status   SARS Coronavirus 2 NEGATIVE NEGATIVE Final    Comment: (NOTE) If result is NEGATIVE SARS-CoV-2 target nucleic acids are NOT DETECTED. The SARS-CoV-2 RNA is generally detectable in upper and lower  respiratory specimens during the acute phase of infection. The lowest   concentration of SARS-CoV-2 viral copies this assay can detect is 250  copies / mL. A negative result does not preclude SARS-CoV-2 infection  and should not be used as the sole basis for treatment or other  patient management decisions.  A negative result may occur with  improper specimen collection / handling, submission of specimen other  than nasopharyngeal swab, presence of viral mutation(s) within the  areas targeted by this assay, and inadequate number of viral copies  (<250 copies / mL). A negative result must be combined with clinical  observations, patient history, and epidemiological information. If result is POSITIVE SARS-CoV-2 target nucleic acids are DETECTED. The SARS-CoV-2 RNA is generally detectable in upper and lower  respiratory specimens dur ing the acute phase of infection.  Positive  results are indicative of active infection  with SARS-CoV-2.  Clinical  correlation with patient history and other diagnostic information is  necessary to determine patient infection status.  Positive results do  not rule out bacterial infection or co-infection with other viruses. If result is PRESUMPTIVE POSTIVE SARS-CoV-2 nucleic acids MAY BE PRESENT.   A presumptive positive result was obtained on the submitted specimen  and confirmed on repeat testing.  While 2019 novel coronavirus  (SARS-CoV-2) nucleic acids may be present in the submitted sample  additional confirmatory testing may be necessary for epidemiological  and / or clinical management purposes  to differentiate between  SARS-CoV-2 and other Sarbecovirus currently known to infect humans.  If clinically indicated additional testing with an alternate test  methodology [phone]([id number]) is advised. The SARS-CoV-2 RNA is generally  detectable in upper and lower respiratory sp ecimens during the acute  phase of infection. The expected result is Negative. Fact Sheet for Patients:  BoilerBrush.com.cyhttps://www.fda.gov/media/136312/download Fact Sheet  for Healthcare Providers: https://pope.com/https://www.fda.gov/media/136313/download This test is not yet approved or cleared by the [place] FDA and has been authorized for detection and/or diagnosis of SARS-CoV-2 by FDA under an Emergency Use Authorization (EUA).  This EUA will remain in effect (meaning this test can be used) for the duration of the COVID-19 declaration under Section 564(b)(1) of the Act, 21 U.S.C. section 360bbb-3(b)(1), unless the authorization is terminated or revoked sooner. Performed at [facility], [address] [place], [place] [address]          Radiology Studies: No results found.      Scheduled Meds: . busPIRone  10 mg Oral TID  . cholecalciferol  2,000 Units Oral Daily  . clonazePAM  1 mg Oral BID  . enoxaparin (LOVENOX) injection  40 mg Subcutaneous Daily  . guanFACINE  2 mg Oral Daily  . metoprolol tartrate  12.5 mg Oral Daily  . naltrexone  25 mg Oral BID  . pantoprazole  40 mg Oral Daily  . polyethylene glycol  17 g Oral 3 times weekly  . sertraline  100 mg Oral Daily  . sodium chloride flush  3 mL Intravenous Q12H  . topiramate  50 mg Oral BID  . vitamin C  500 mg Oral Daily  . ziprasidone  60 mg Oral Daily   Continuous Infusions: . sodium chloride Stopped (11/14/18 1330)     LOS: 2 days     Alwyn RenElizabeth G Mekaila Tarnow, MD [facility]  If 7PM-7AM, please contact night-coverage [email or web address] Password [email or web address] 11/16/2018, 10:31 AM

## 2018-11-16 NOTE — Plan of Care (Signed)
Adequate for discharge.

## 2018-11-23 ENCOUNTER — Encounter: Payer: Self-pay | Admitting: Medical

## 2018-11-23 ENCOUNTER — Other Ambulatory Visit: Payer: Self-pay

## 2018-11-23 ENCOUNTER — Telehealth: Payer: Self-pay | Admitting: Medical

## 2018-11-23 ENCOUNTER — Ambulatory Visit (INDEPENDENT_AMBULATORY_CARE_PROVIDER_SITE_OTHER): Payer: Medicaid Other | Admitting: Medical

## 2018-11-23 VITALS — BP 120/74 | HR 85 | Temp 98.9°F | Resp 16 | Wt 152.0 lb

## 2018-11-23 DIAGNOSIS — R479 Unspecified speech disturbances: Secondary | ICD-10-CM

## 2018-11-23 DIAGNOSIS — R634 Abnormal weight loss: Secondary | ICD-10-CM

## 2018-11-23 DIAGNOSIS — R569 Unspecified convulsions: Secondary | ICD-10-CM | POA: Diagnosis not present

## 2018-11-23 DIAGNOSIS — Q8719 Other congenital malformation syndromes predominantly associated with short stature: Secondary | ICD-10-CM

## 2018-11-23 DIAGNOSIS — F4310 Post-traumatic stress disorder, unspecified: Secondary | ICD-10-CM | POA: Diagnosis not present

## 2018-11-23 DIAGNOSIS — Z91419 Personal history of unspecified adult abuse: Secondary | ICD-10-CM

## 2018-11-23 DIAGNOSIS — R4689 Other symptoms and signs involving appearance and behavior: Secondary | ICD-10-CM

## 2018-11-23 DIAGNOSIS — R000 Tachycardia, unspecified: Secondary | ICD-10-CM

## 2018-11-23 DIAGNOSIS — G40909 Epilepsy, unspecified, not intractable, without status epilepticus: Secondary | ICD-10-CM

## 2018-11-23 DIAGNOSIS — Z9289 Personal history of other medical treatment: Secondary | ICD-10-CM

## 2018-11-23 DIAGNOSIS — F79 Unspecified intellectual disabilities: Secondary | ICD-10-CM

## 2018-11-23 DIAGNOSIS — Z79899 Other long term (current) drug therapy: Secondary | ICD-10-CM

## 2018-11-23 DIAGNOSIS — R454 Irritability and anger: Secondary | ICD-10-CM

## 2018-11-23 DIAGNOSIS — F419 Anxiety disorder, unspecified: Secondary | ICD-10-CM

## 2018-11-23 DIAGNOSIS — Q899 Congenital malformation, unspecified: Secondary | ICD-10-CM

## 2018-11-23 DIAGNOSIS — R519 Headache, unspecified: Secondary | ICD-10-CM
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
## 2018-11-23 NOTE — Telephone Encounter (Signed)
As follow up from today's visit ( and since the discharge summary didn't seem complete)  1-please call her mother to get a list of her most up-to-date medications and update her chart record 2- I recommend they try to resume face-to-face counseling visits with Benjamine Mola her counselor as soon as possible.  I recommend a type of counseling that would be similar to interactive play counseling like they would do with a child.  This should be weekly if possible and expect small improvements or small discoveries or breakthroughs.  Do not have expectations high as if she is going to voice some major revelation or major concern. 3-follow-up with Dr. Georgette Shell psychiatry for hospital follow-up and medication check 4- if parents want a neurology consult, then was referred to [facility] neurology.  That is reasonable since she did in fact have a seizure.  However the EEG did not show ongoing seizure activity and the hospital reports suggest that the seizure was brought on by abrupt cessation of her psychiatric medications.  Thus I am not so sure she needs to see neurology 5- I strongly recommend they provide her daytime caregiver with some general guidelines that are simple and easy to read as far as food choices forlorn for breakfast lunch and snacks during the day.  I recommend they do not even my unhealthy junk food to have around the house to avoid a lot of salt and high sugar foods as this is likely what led to her swelling and elevated blood pressures prior.  Since she has lost weight and has not been eating healthfully the last several weeks her blood pressure issue and swelling has resolved.  And she is doing fine on the metoprolol at the moment.  Let us plan a follow-up with me in a month

## 2018-11-24 ENCOUNTER — Emergency Department (HOSPITAL_COMMUNITY)
Admission: EM | Admit: 2018-11-24 | Discharge: 2018-11-24 | Disposition: A | Discharge status: Home / Self Care | Payer: Medicaid Other | Attending: Emergency Medicine | Admitting: Emergency Medicine

## 2018-11-24 ENCOUNTER — Emergency Department (HOSPITAL_COMMUNITY): Payer: Medicaid Other

## 2018-11-24 ENCOUNTER — Encounter (HOSPITAL_COMMUNITY): Payer: Self-pay

## 2018-11-24 ENCOUNTER — Telehealth: Payer: Self-pay | Admitting: Neurology

## 2018-11-24 DIAGNOSIS — R51 Headache: Secondary | ICD-10-CM

## 2018-11-24 DIAGNOSIS — R519 Headache, unspecified: Secondary | ICD-10-CM

## 2018-11-24 DIAGNOSIS — F79 Unspecified intellectual disabilities: Secondary | ICD-10-CM | POA: Diagnosis not present

## 2018-11-24 LAB — CBC WITH DIFFERENTIAL/PLATELET
Abs Immature Granulocytes: 0.05 10*3/uL (ref 0.00–0.07)
Basophils Absolute: 0 10*3/uL (ref 0.0–0.1)
Basophils Relative: 0 %
Eosinophils Absolute: 0.1 10*3/uL (ref 0.0–0.5)
Eosinophils Relative: 1 %
HCT: 41.6 % (ref 36.0–46.0)
Hemoglobin: 13.5 g/dL (ref 12.0–15.0)
Immature Granulocytes: 1 %
Lymphocytes Relative: 29 %
Lymphs Abs: 2.7 10*3/uL (ref 0.7–4.0)
MCH: 30 pg (ref 26.0–34.0)
MCHC: 32.5 g/dL (ref 30.0–36.0)
MCV: 92.4 fL (ref 80.0–100.0)
Monocytes Absolute: 0.8 10*3/uL (ref 0.1–1.0)
Monocytes Relative: 9 %
Neutro Abs: 5.7 10*3/uL (ref 1.7–7.7)
Neutrophils Relative %: 60 %
Platelets: 220 10*3/uL (ref 150–400)
RBC: 4.5 MIL/uL (ref 3.87–5.11)
RDW: 12 % (ref 11.5–15.5)
WBC: 9.3 10*3/uL (ref 4.0–10.5)
nRBC: 0 % (ref 0.0–0.2)

## 2018-11-24 LAB — COMPREHENSIVE METABOLIC PANEL
ALT: 32 U/L (ref 0–44)
AST: 19 U/L (ref 15–41)
Albumin: 4 g/dL (ref 3.5–5.0)
Alkaline Phosphatase: 78 U/L (ref 38–126)
Anion gap: 11 (ref 5–15)
BUN: 17 mg/dL (ref 6–20)
CO2: 19 mmol/L — ABNORMAL LOW (ref 22–32)
Calcium: 9.4 mg/dL (ref 8.9–10.3)
Chloride: 112 mmol/L — ABNORMAL HIGH (ref 98–111)
Creatinine, Ser: 1.09 mg/dL — ABNORMAL HIGH (ref 0.44–1.00)
GFR calc Af Amer: >60 mL/min (ref >60)
GFR calc non Af Amer: >60 mL/min (ref >60)
Glucose, Bld: 89 mg/dL (ref 70–99)
Potassium: 3.6 mmol/L (ref 3.5–5.1)
Sodium: 142 mmol/L (ref 135–145)
Total Bilirubin: 0.7 mg/dL (ref 0.3–1.2)
Total Protein: 7.6 g/dL (ref 6.5–8.1)

## 2018-11-24 LAB — MAGNESIUM: Magnesium: 1.8 mg/dL (ref 1.7–2.4)

## 2018-11-24 MED ORDER — LACTATED RINGERS IV BOLUS
1000.0000 mL | Freq: Once | INTRAVENOUS | Status: AC
  Administered 2018-11-24: 1000 mL via INTRAVENOUS

## 2018-11-24 MED ORDER — DIVALPROEX SODIUM 250 MG PO DR TAB: 750.0000 mg | DELAYED_RELEASE_TABLET | Freq: Every day | ORAL | 0 refills | Status: DC

## 2018-11-24 MED ORDER — ACETAMINOPHEN 500 MG PO TABS
1000.0000 mg | ORAL_TABLET | Freq: Once | ORAL | Status: AC
  Administered 2018-11-24: 1000 mg via ORAL
  Filled 2018-11-24: qty 2

## 2018-11-24 NOTE — ED Notes (Signed)
Neurology at the bedside

## 2018-11-24 NOTE — ED Triage Notes (Addendum)
Pt has a Headache since yestersday, 12 seizures in 36 hours. Neurologist said if pt starts having headache to come to ED. Per mom on scene pt is at baseline. Pt just started having seizures about 1 week ago, no seizure history prior to this. Pt is very limited verbally. This is normal for the pt. Mom states that the pt has been having "behaviors and she will clench up and get stiff and then she will shake her leg or her arm and it's hard to redirect her. Pt's like she is aggravated"
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
## 2018-11-24 NOTE — Telephone Encounter (Signed)
Patient's mom notified.   Patient has been having hiccups for 4-5 hours at a time, then they will stop and come back for 4-5 hours again.   Please advise.

## 2018-11-24 NOTE — Telephone Encounter (Signed)
There are certain medications that can be used for hiccups but I need her med list updated before I can weight in on this.

## 2018-11-24 NOTE — ED Notes (Signed)
Neurology at bedside.

## 2018-11-24 NOTE — Telephone Encounter (Signed)
Left message on voicemail for patient's mom to call back

## 2018-11-24 NOTE — ED Notes (Signed)
Dr. Mesner at bedside   

## 2018-11-24 NOTE — Consult Note (Signed)
Requesting Physician: Dr. Clayborne DanaMesner    Chief Complaint: Altered behavior, staring spells  History obtained from: Patient and Chart     HPI:                                                                                                                                       Dana Mcneil is a 28 y.o. 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  The patient was admitted in June after presenting with multiple generalized seizures in the setting of abrupt discontinuation of clonazepam as well as Depakote.  Patient has longstanding history of behavioral issues and saw a new provider who made the medication adjustment.  It appears there was an error and instead of tapering dose of clonazepam, the pharmacy did not fill the medication at all and the patient's mother thought this was discontinued. The patient had multiple seizures and had another seizure while in the hospital.  Patient was placed on LTM EEG for close to 48 hours, was essentially normal.  Patient had a few pushbutton events which were nonepileptic.  Clonazepam was restarted at this point and she was discharged being placed back on the medication.  Depakote was not restarted with EEG showing no epileptiform discharges to raise suspicion for possible underlying seizure disorder.   Patient's neuropsychiatrist has now started to slowly taper clonazepam to 0.5 mg 3 times daily.   However since her discharge, patient's behavior has not returned to baseline.  She has been increasingly upset, having episodes of hitting herself on the head.  She also has multiple staring spells that sometimes can be distractible but other times continue despite  distraction.  She also has episodes where she looks down and starts shaking her right leg, but is awake the entire time and also can stop with distraction.  Due to increasing nature of the spells, patient's mother decided to bring her to the emergency department.    Past Medical History:  Diagnosis Date  . Bipolar 1 disorder ([facility]) 11/14/2018  . [name]   . MR (mental retardation)     History reviewed. No pertinent surgical history.  Family History  Problem Relation Age of Onset  . Diabetes Father   . Cancer Maternal Grandfather   . Hyperlipidemia Other   . Hypertension Other   . Colon cancer Neg Hx   . Colon polyps Neg Hx   . Kidney disease Neg Hx   . Esophageal cancer Neg Hx    Social History:  reports that she has never smoked. She has never used smokeless tobacco. She reports that she does not drink alcohol or use drugs.  Allergies:  Allergies  Allergen Reactions  . Haldol [Haloperidol Lactate]     hyperactive  . Lorazepam     Ativan- hyperactive    Medications:                                                                                                                        I reviewed home medications   ROS:                                                                                                                                     14 systems reviewed and negative except above    Examination:                                                                                                     General: Appears small for age, small hands and feet Psych: Affect appropriate to situation Eyes: No scleral injection HENT: No OP obstrucion Head: Microcephaly Cardiovascular: Normal rate and regular rhythm.  Respiratory: Effort normal and breath sounds normal to anterior ascultation GI: Soft.  No distension. There is no tenderness.  Skin: WDI    Neurological Examination Mental Status: Alert, Responds. with one-word answers. Able to follow  simple commands. Cranial Nerves: II: Visual fields grossly normal,  III,IV, VI: ptosis not present, extra-ocular motions intact bilaterally, pupils equal, round, reactive to light and accommodation V,VII: smile symmetric, facial light touch sensation normal bilaterally VIII: hearing normal bilaterally IX,X: uvula rises symmetrically XI: bilateral shoulder shrug XII: midline tongue extension Motor: Right : Upper extremity   5/5    Left:     Upper extremity   5/5  Lower extremity   5/5     Lower extremity   5/5 Tone and bulk:normal tone throughout; no atrophy noted Sensory: Pinprick and light touch intact throughout, bilaterally Plantars: Right: downgoing   Left: downgoing Cerebellar: normal finger-to-nose      Lab Results: Basic Metabolic Panel: Recent Labs  Lab 11/24/18 1549  NA 142  K 3.6  CL 112*  CO2 19*  GLUCOSE 89  BUN 17  CREATININE 1.09*  CALCIUM 9.4  MG 1.8    CBC: Recent Labs  Lab 11/24/18 1549  WBC 9.3  NEUTROABS 5.7  HGB 13.5  HCT 41.6  MCV 92.4  PLT 220    Coagulation Studies: No results for input(s): LABPROT, INR in the last 72 hours.  Imaging: Ct Head Wo Contrast  Result Date: 11/24/2018 CLINICAL DATA:  Pt has a Headache since yestersday, 12 seizures in 36 hours. Neurologist said if pt starts having headache to come to ED. Per mom on scene pt is at baseline. Pt just started having seizures about 1 week ago, no seizure history prior to this. Pt is very limited verbally. This is normal for the pt. Mom states that the pt has been having "behaviors and she will clench up and get stiff and then she will shake her leg or her arm and it's hard to redirect her." EXAM: CT HEAD WITHOUT CONTRAST TECHNIQUE: Contiguous axial images were obtained from the base of the skull through the vertex without intravenous contrast. COMPARISON:  11/13/2018 and older exams. FINDINGS: Brain: No evidence of acute infarction, hemorrhage, hydrocephalus, extra-axial collection or  mass lesion/mass effect. Vascular: No hyperdense vessel or unexpected calcification. Skull: Normal. Negative for fracture or focal lesion. Sinuses/Orbits: Globes and orbits are within normal limits. Polypoid mucosal thickening opacifies most of the left maxillary sinus. A polyp extends into the left nasal cavity. Mucosal thickening opacifies an anterior left ethmoid air cell and there is mucosal thickening mostly opacified in the left frontal sinus. Remaining sinuses are clear. Clear mastoid air cells. Other: None. IMPRESSION: 1. No intracranial abnormality. 2. 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 Electronically Signed   By: Amie Portlandavid  Ormond M.D.   On: 11/24/2018 16:26   Dg Chest Portable 1 View  Result Date: 11/24/2018 CLINICAL DATA:  Possible pneumonia EXAM: PORTABLE CHEST 1 VIEW COMPARISON:  11/13/2018 FINDINGS: Cardiac shadow is stable. The lungs are well aerated bilaterally. No focal infiltrate or effusion is seen. No bony abnormality is noted. IMPRESSION: No active disease. Electronically Signed   By: Alcide CleverMark  Lukens M.D.   On: 11/24/2018 17:27      ASSESSMENT AND PLAN   Behavioral spells -Based on description of patient's events by mother, low suspicion that these are electrographic seizures.  Patient also does not have history of seizures prior to abrupt onset of clonazepam.  Also long-term EEG did not show epileptiform discharges. -Depakote was discontinued by her psychiatrist, recommended restarting this medication for the time being. -Recommended capturing spells on video. -Counseled on seizure precautions and need to bring patient to the ER if she has generalized seizure -Patient has out patient follow-up with Dr. Patrcia DollyKaren Aquino, if spells continue to occur then it would be reasonable to get ambulatory EEG  Sushanth Aroor [facility] Pager Number [phone]

## 2018-11-24 NOTE — ED Notes (Signed)
Pt points to right eyebrow when asked where her pain is.

## 2018-11-24 NOTE — ED Notes (Signed)
Patient Alert and oriented to baseline. Stable and ambulatory to baseline. Patient verbalized understanding of the discharge instructions.  Patient belongings were taken by the patient.   

## 2018-11-24 NOTE — Telephone Encounter (Signed)
Her med list in updated in the chart.  Verified with her mom

## 2018-11-24 NOTE — Telephone Encounter (Signed)
New Message  Patients mother wanted to let Dr. Delice Lesch know this patient has been admitted to the ED due to seizures.  Patients mother called to advised Dr. Delice Lesch and patients mom knows Dr. Delice Lesch can see patients chart.  Has not come back cognitively a week and a half ago and patient's mom verbalized she had 12 seizures that day.  Referral was approved and we called patient to schedule appt on 01/03/2019 @ 10:30 am.  Please f/u with patients mom

## 2018-11-25 NOTE — Telephone Encounter (Signed)
Appt moved to 12/02/18 at 10:30am

## 2018-11-25 NOTE — Telephone Encounter (Signed)
As a follow-up from her visit the other day in the office, I do recommend she get back in with face-to-face counseling away as I think this would be most helpful.  Please relate to her mother as I discussed with her father, I think she needs one-on-one counseling similar to the a way they do with kids with counseling with play structure as opposed to direct questions answered counseling.  I would not expect major breakthroughs but rather hope for small victories here and there, small gains in progress.  For example, if you learn of certain things she enjoys or likes whether it be a favorite song or favorite show your favorite craft, then do those things with her.  However if you learn from the counselor that there are certain things that bring up bad memories or give her unpleasant feelings, then avoid those things.  This may require being sensitive the subtle cues.  I would assume they will have to have some way of doing a debriefing after counseling sessions with a counselor to have things to work on weekly, or to have a way to measure progress.  From a dietary standpoint I expressed to father that I would recommend he has some parameters for the caregiver during the day in regards to breakfast and lunch and snacks.  Instead of just giving Kenlynn and caregiver free reign, give a few options for what she can eat during the day, like a menu or planned meals.  I think this will help limit salt and unhealthy foods which contribute to the weight gain and swelling.  This is much like the way you have to do for a child.  Regarding hiccups: If she having them in her sleep?  Have they been going on greater than 48 hours?  Some ways you can make hiccups go away include sipping cold water, gargling water, taking your hands and pressing in with your palms on both eyeballs for a few seconds and then releasing, pulling your knees up into your chest and holding for 10 seconds then releasing, or leaning forward in your  seat with your chest on your lap for 10 seconds.  Most the time those physical maneuvers can make a hiccup pattern go away.  The problem is, she is already on 2 medicines that are used to resolve hiccups according to the chart.  Omeprazole which is a proton pump inhibitor is used to treat hiccups as well as anti-seizure medicine like topiramate or some of the other medication she is already taking.  So I would recommend she try some of the physical maneuvers above.  If this doesn't work over the next few days ,let me know

## 2018-12-02 ENCOUNTER — Telehealth: Payer: Medicaid Other | Admitting: Neurology

## 2018-12-03 ENCOUNTER — Encounter: Payer: Self-pay | Admitting: Neurology

## 2018-12-03 ENCOUNTER — Other Ambulatory Visit: Payer: Self-pay

## 2018-12-03 ENCOUNTER — Ambulatory Visit (INDEPENDENT_AMBULATORY_CARE_PROVIDER_SITE_OTHER): Payer: Medicaid Other | Admitting: Neurology

## 2018-12-03 VITALS — HR 100 | Temp 98.3°F | Ht 59.0 in | Wt 149.0 lb

## 2018-12-03 DIAGNOSIS — Q8719 Other congenital malformation syndromes predominantly associated with short stature: Secondary | ICD-10-CM | POA: Diagnosis not present

## 2018-12-03 DIAGNOSIS — F989 Unspecified behavioral and emotional disorders with onset usually occurring in childhood and adolescence: Secondary | ICD-10-CM

## 2018-12-03 DIAGNOSIS — R569 Unspecified convulsions: Secondary | ICD-10-CM

## 2018-12-03 NOTE — Patient Instructions (Signed)
1. Continue working with Dr. Darleene Cleaver on streamlining medications  2. If seizure recurs, call our office and we will do another EEG  3. Follow-up in 3-4 months, call for any changes
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
## 2018-12-10 ENCOUNTER — Telehealth: Payer: Self-pay | Admitting: Neurology

## 2018-12-10 NOTE — Telephone Encounter (Signed)
I don't know much about [facility] [place] except for what I see online, but it sounds like a possibility for Novice with how she is doing now. Thanks

## 2018-12-10 NOTE — Telephone Encounter (Signed)
Spoke with Dana Mcneil she was informed of provider response

## 2018-12-10 NOTE — Telephone Encounter (Signed)
Called spoke with Jeanie  Pt is not eating, not getting up, not active at all only gets up to She spoke with case manager They are going to try placing her at [facility]  wanted to know what Dr. Delice Lesch thinks opinion about sending patient to [facility] or other ideas that can help with her. Medications has been adjusted she does not know the names of the medication at this time. Pt psychotherapist has not been active or concern with their questions concerning patient.

## 2018-12-10 NOTE — Telephone Encounter (Signed)
Mother is calling in about her being in the bed all day and not eating. She is wanting to know what to do. She said she was thinking about [name]? To get her back on track but was asking Dr. Delice Lesch opinion. Thanks!

## 2018-12-15 ENCOUNTER — Telehealth: Payer: Self-pay

## 2018-12-15 NOTE — Telephone Encounter (Signed)
Patient's mom called and wanted to know if we could order another swallow study?  Patient is throwing up her food after she eats and still refusing to eat.   They have an appointment with Dr Watt Climes in August 20.2020.  She is having problems since the seizures and her speech is not as good as before the seizures.

## 2018-12-17 ENCOUNTER — Other Ambulatory Visit: Payer: Self-pay | Admitting: Medical

## 2018-12-17 NOTE — Telephone Encounter (Signed)
That is fine if they want to pursue sleep study.  Please set this up like we did last time.

## 2018-12-23 ENCOUNTER — Other Ambulatory Visit: Payer: Self-pay

## 2018-12-23 DIAGNOSIS — J45909 Unspecified asthma, uncomplicated: Secondary | ICD-10-CM

## 2018-12-23 DIAGNOSIS — T7840XA Allergy, unspecified, initial encounter: Secondary | ICD-10-CM

## 2018-12-23 MED ORDER — FLUTICASONE PROPIONATE 50 MCG/ACT NA SUSP: 4.0000 | Freq: Every day | NASAL | 4 refills | Status: DC

## 2018-12-28 ENCOUNTER — Other Ambulatory Visit: Payer: Self-pay

## 2018-12-28 ENCOUNTER — Emergency Department (HOSPITAL_BASED_OUTPATIENT_CLINIC_OR_DEPARTMENT_OTHER): Payer: Medicaid Other

## 2018-12-28 ENCOUNTER — Emergency Department (HOSPITAL_BASED_OUTPATIENT_CLINIC_OR_DEPARTMENT_OTHER)
Admission: EM | Admit: 2018-12-28 | Discharge: 2018-12-28 | Disposition: A | Discharge status: Home / Self Care | Payer: Medicaid Other | Attending: Emergency Medicine | Admitting: Emergency Medicine

## 2018-12-28 ENCOUNTER — Encounter (HOSPITAL_BASED_OUTPATIENT_CLINIC_OR_DEPARTMENT_OTHER): Payer: Self-pay | Admitting: *Deleted

## 2018-12-28 DIAGNOSIS — Y998 Other external cause status: Secondary | ICD-10-CM | POA: Insufficient documentation

## 2018-12-28 DIAGNOSIS — Y939 Activity, unspecified: Secondary | ICD-10-CM | POA: Insufficient documentation

## 2018-12-28 DIAGNOSIS — F79 Unspecified intellectual disabilities: Secondary | ICD-10-CM | POA: Diagnosis not present

## 2018-12-28 DIAGNOSIS — S0990XA Unspecified injury of head, initial encounter: Secondary | ICD-10-CM | POA: Diagnosis present

## 2018-12-28 DIAGNOSIS — Y929 Unspecified place or not applicable: Secondary | ICD-10-CM | POA: Insufficient documentation

## 2018-12-28 DIAGNOSIS — Z79899 Other long term (current) drug therapy: Secondary | ICD-10-CM | POA: Insufficient documentation

## 2018-12-28 DIAGNOSIS — W2209XA Striking against other stationary object, initial encounter: Secondary | ICD-10-CM | POA: Insufficient documentation

## 2018-12-28 DIAGNOSIS — S0101XA Laceration without foreign body of scalp, initial encounter: Secondary | ICD-10-CM | POA: Insufficient documentation

## 2018-12-28 MED ORDER — LIDOCAINE-EPINEPHRINE-TETRACAINE (LET) SOLUTION
NASAL | Status: AC
  Filled 2018-12-28: qty 3
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
## 2018-12-28 NOTE — ED Triage Notes (Signed)
Scalp laceration. She hit it on the corner of a wall.

## 2018-12-28 NOTE — Discharge Instructions (Addendum)
Staples will need to be removed in 7 to 10 days.  There were 3 staples placed.  May have these taken out at PCP, urgent care or the emergency department.  Return for any new worsening symptoms.

## 2019-01-03 ENCOUNTER — Ambulatory Visit: Payer: Medicaid Other | Admitting: Neurology

## 2019-01-03 ENCOUNTER — Telehealth: Payer: Self-pay
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
## 2019-01-03 NOTE — Telephone Encounter (Signed)
I think her FMLA is up to date, please check when we last signed off on this?  These are generally updated every 6 months.   We can write note out for that specified time frame requested.  Regarding continuous FMLA, what do you mean?

## 2019-01-04 ENCOUNTER — Encounter: Payer: Self-pay | Admitting: Family Medicine

## 2019-01-04 NOTE — Telephone Encounter (Signed)
I reviewed the FMLA on file which is still in date.   I can't given unlimited or continuous FMLA open ended dates.    If she needs a specific period of time out of work, such as leave of absence or specific time out of work such as 1 week, then I need those dates and a new form.    Or alternatively, we can write a work note out 1 week for example which may suffice in lieu of writing a whole new FMLA.

## 2019-01-04 NOTE — Telephone Encounter (Signed)
FLMA paperwork given to you today.  It needs to say continuous days out and more hours until new staff can come in to work with Dana Mcneil.

## 2019-01-05 ENCOUNTER — Encounter: Payer: Self-pay | Admitting: Medical

## 2019-01-05 ENCOUNTER — Ambulatory Visit (INDEPENDENT_AMBULATORY_CARE_PROVIDER_SITE_OTHER): Payer: Medicaid Other | Admitting: Medical

## 2019-01-05 ENCOUNTER — Other Ambulatory Visit: Payer: Self-pay

## 2019-01-05 ENCOUNTER — Ambulatory Visit: Payer: Medicaid Other | Admitting: Medical

## 2019-01-05 VITALS — BP 120/80 | HR 106 | Temp 97.7°F | Resp 16 | Ht 59.0 in | Wt 144.8 lb

## 2019-01-05 DIAGNOSIS — Z79899 Other long term (current) drug therapy: Secondary | ICD-10-CM

## 2019-01-05 DIAGNOSIS — J321 Chronic frontal sinusitis: Secondary | ICD-10-CM

## 2019-01-05 DIAGNOSIS — F411 Generalized anxiety disorder: Secondary | ICD-10-CM

## 2019-01-05 DIAGNOSIS — R519 Headache, unspecified: Secondary | ICD-10-CM

## 2019-01-05 DIAGNOSIS — F4310 Post-traumatic stress disorder, unspecified: Secondary | ICD-10-CM

## 2019-01-05 DIAGNOSIS — R51 Headache: Secondary | ICD-10-CM

## 2019-01-05 DIAGNOSIS — F79 Unspecified intellectual disabilities: Secondary | ICD-10-CM

## 2019-01-05 DIAGNOSIS — Z4802 Encounter for removal of sutures: Secondary | ICD-10-CM

## 2019-01-05 DIAGNOSIS — Q899 Congenital malformation, unspecified: Secondary | ICD-10-CM

## 2019-01-05 MED ORDER — AMOXICILLIN 875 MG PO TABS: 875.0000 mg | ORAL_TABLET | Freq: Two times a day (BID) | ORAL | 0 refills | Status: DC
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
## 2019-01-05 NOTE — Telephone Encounter (Signed)
Patient's mom Dana Mcneil notified
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
## 2019-01-06 ENCOUNTER — Encounter (HOSPITAL_COMMUNITY): Payer: Self-pay | Admitting: Emergency Medicine

## 2019-01-06 ENCOUNTER — Emergency Department (HOSPITAL_COMMUNITY)
Admission: EM | Admit: 2019-01-06 | Discharge: 2019-01-06 | Disposition: A | Discharge status: Home / Self Care | Payer: Medicaid Other | Attending: Emergency Medicine | Admitting: Emergency Medicine

## 2019-01-06 ENCOUNTER — Ambulatory Visit (HOSPITAL_COMMUNITY)
Admission: RE | Admit: 2019-01-06 | Discharge: 2019-01-06 | Disposition: A | Discharge status: Home / Self Care | Payer: Medicaid Other | Attending: Psychiatry | Admitting: Psychiatry

## 2019-01-06 ENCOUNTER — Other Ambulatory Visit: Payer: Self-pay

## 2019-01-06 DIAGNOSIS — R4689 Other symptoms and signs involving appearance and behavior: Secondary | ICD-10-CM

## 2019-01-06 DIAGNOSIS — Z888 Allergy status to other drugs, medicaments and biological substances status: Secondary | ICD-10-CM | POA: Insufficient documentation

## 2019-01-06 DIAGNOSIS — F419 Anxiety disorder, unspecified: Secondary | ICD-10-CM | POA: Insufficient documentation

## 2019-01-06 DIAGNOSIS — F4310 Post-traumatic stress disorder, unspecified: Secondary | ICD-10-CM | POA: Diagnosis not present

## 2019-01-06 DIAGNOSIS — Q8719 Other congenital malformation syndromes predominantly associated with short stature: Secondary | ICD-10-CM | POA: Insufficient documentation

## 2019-01-06 DIAGNOSIS — I10 Essential (primary) hypertension: Secondary | ICD-10-CM | POA: Insufficient documentation

## 2019-01-06 DIAGNOSIS — R450 Nervousness: Secondary | ICD-10-CM | POA: Insufficient documentation

## 2019-01-06 DIAGNOSIS — G4700 Insomnia, unspecified: Secondary | ICD-10-CM | POA: Diagnosis not present

## 2019-01-06 DIAGNOSIS — F319 Bipolar disorder, unspecified: Secondary | ICD-10-CM | POA: Diagnosis not present

## 2019-01-06 DIAGNOSIS — F79 Unspecified intellectual disabilities: Secondary | ICD-10-CM | POA: Diagnosis not present

## 2019-01-06 DIAGNOSIS — R456 Violent behavior: Secondary | ICD-10-CM | POA: Diagnosis present

## 2019-01-06 DIAGNOSIS — F70 Mild intellectual disabilities: Secondary | ICD-10-CM | POA: Diagnosis not present

## 2019-01-06 DIAGNOSIS — G40909 Epilepsy, unspecified, not intractable, without status epilepticus: Secondary | ICD-10-CM | POA: Diagnosis not present

## 2019-01-06 DIAGNOSIS — Z79899 Other long term (current) drug therapy: Secondary | ICD-10-CM | POA: Diagnosis not present
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
## 2019-01-06 NOTE — ED Notes (Signed)
Per chart review, Pt was seen and assessed at [facility] about 2hrs ago.  TTS note: Tinnie Gens, NP recommends D/C and follow-up with outpatient resources. Pt provided with resources for local psychiatrists.

## 2019-01-06 NOTE — ED Triage Notes (Signed)
Pt brought in by mother and healthcare worker for aggressive behavior and hurting herself. Pt was seen at [facility] today and caregivers said they didn't do anything for her so they came here.

## 2019-01-28 ENCOUNTER — Other Ambulatory Visit: Payer: Self-pay | Admitting: Medical

## 2019-01-28 NOTE — Telephone Encounter (Signed)
I received med request to send to pharmacy for depo provera.   Is she not coming in for Depo Provera injection q19months?

## 2019-01-28 NOTE — Telephone Encounter (Signed)
Is this okay to refill? 

## 2019-01-31 NOTE — Telephone Encounter (Signed)
lmom asking for a call back about whether patient actually needs refill sent to the pharmacy or if she will need to come here and get it.

## 2019-01-31 NOTE — Telephone Encounter (Signed)
Confirmed medication to be sent to [facility].

## 2019-02-01 MED ORDER — MEDROXYPROGESTERONE ACETATE 150 MG/ML IM SUSP: 150.0000 mg | INTRAMUSCULAR | 0 refills | Status: DC

## 2019-02-01 NOTE — Addendum Note (Signed)
Addended by: Carlena Hurl on: 02/01/2019 09:39 AM   Modules accepted: Orders

## 2019-02-10 ENCOUNTER — Other Ambulatory Visit (INDEPENDENT_AMBULATORY_CARE_PROVIDER_SITE_OTHER): Payer: Medicaid Other

## 2019-02-10 ENCOUNTER — Other Ambulatory Visit: Payer: Self-pay

## 2019-02-10 DIAGNOSIS — Z23 Encounter for immunization: Secondary | ICD-10-CM | POA: Diagnosis not present

## 2019-02-11 ENCOUNTER — Other Ambulatory Visit: Payer: Self-pay | Admitting: Medical

## 2019-02-11 ENCOUNTER — Telehealth: Payer: Self-pay

## 2019-02-11 MED ORDER — CETIRIZINE HCL 10 MG PO TABS: 10.0000 mg | ORAL_TABLET | Freq: Every day | ORAL | 2 refills | Status: DC

## 2019-02-11 NOTE — Telephone Encounter (Signed)
Cetirizine/zyrtec sent to pharmacy

## 2019-02-11 NOTE — Telephone Encounter (Signed)
Informed.

## 2019-02-11 NOTE — Telephone Encounter (Signed)
[name] called and stated patient needs something sent to the pharmacy for allergies to [facility]. Please advise.

## 2019-03-04 ENCOUNTER — Ambulatory Visit: Payer: Medicaid Other | Admitting: Neurology

## 2019-03-04 ENCOUNTER — Telehealth: Payer: Self-pay | Admitting: Neurology

## 2019-03-04 NOTE — Telephone Encounter (Signed)
Mom called in to cancel appt today and she just wanted to let you know that patient is doing well regarding her seizures. She hasn't had any. She is however suffering with behaviors as she called it. And today has just not been a good day so far. She has been in and out of the hospital for this but she just wanted you to know why the cancellation today. Do not have to call her back. Just FYI. Thanks!

## 2019-03-04 NOTE — Telephone Encounter (Signed)
noted 

## 2019-03-21 ENCOUNTER — Other Ambulatory Visit: Payer: Self-pay

## 2019-03-21 DIAGNOSIS — Z20822 Contact with and (suspected) exposure to covid-19: Secondary | ICD-10-CM

## 2019-03-23 ENCOUNTER — Telehealth: Payer: Self-pay | Admitting: General Practice

## 2019-03-23 LAB — NOVEL CORONAVIRUS, NAA: SARS-CoV-2, NAA: NOT DETECTED

## 2019-03-23 NOTE — Telephone Encounter (Signed)
Pt's mother called in for covid results ° °Advised of Not Detected result  °

## 2019-05-18 ENCOUNTER — Encounter: Payer: Self-pay | Admitting: Medical

## 2019-05-18 ENCOUNTER — Ambulatory Visit (INDEPENDENT_AMBULATORY_CARE_PROVIDER_SITE_OTHER): Payer: Medicaid Other | Admitting: Medical

## 2019-05-18 ENCOUNTER — Other Ambulatory Visit: Payer: Self-pay

## 2019-05-18 VITALS — Ht 59.0 in | Wt 152.0 lb

## 2019-05-18 DIAGNOSIS — J0110 Acute frontal sinusitis, unspecified: Secondary | ICD-10-CM

## 2019-05-18 DIAGNOSIS — Z79899 Other long term (current) drug therapy: Secondary | ICD-10-CM

## 2019-05-18 MED ORDER — AMOXICILLIN 875 MG PO TABS: 875.0000 mg | ORAL_TABLET | Freq: Two times a day (BID) | ORAL | 0 refills | Status: DC

## 2019-05-18 NOTE — Progress Notes (Signed)
This visit type was conducted due to national recommendations for restrictions regarding the COVID-19 Pandemic (e.g. social distancing) in an effort to limit this patient's exposure and mitigate transmission in our community.  Due to their co-morbid illnesses, this patient is at least at moderate risk for complications without adequate follow up.  This format is felt to be most appropriate for this patient at this time.    Documentation for virtual audio and video telecommunications through Zoom encounter:  The patient was located at home. The provider was located in the office. The patient did consent to this visit and is aware of possible charges through their insurance for this visit.  The other persons participating in this telemedicine service were none. Time spent on call was 15 minutes and in review of previous records >15 minutes total.  This virtual service is not related to other E/M service within previous 7 days.   Subjective: Chief Complaint  Patient presents with  . Sinus Problem    headache, congestion, facial pressure    Virtual consult today.  4-5 day hx/o headache.  Has allergy problems.   Has been puffy around eyes like sinus infection or allergies.   Has been around some dogs.   No fever, no cough.  Has some nasal dripping.   No sore throat.   No obvious body aches or chills.  No vomiting.   No loose stools.  No rash or new skin changes.   Mom has given sudafed a few days.   No sick contacts, no covid contacts.  Using flonase daily.  Takes allergy pill at night daily.  Sudafed and tylenol seems to help.    Home BPs have been fine.  Her psychiatrist has changed some medicaiton in recent months.  On Guanfacine now as part of her mental health medications so metoprolol was stopped due to low BP readings.   Currently seeing [facility], Dr. Junious Dresser? for psychiatry o [place].    Past Medical History:  Diagnosis Date  . Bipolar 1 disorder ([place]) 11/14/2018  . Cornelia  de Lange syndrome   . MR (mental retardation)    Current Outpatient Medications on File Prior to Visit  Medication Sig Dispense Refill  . acetaminophen (TYLENOL) 325 MG tablet Take 2 tablets (650 mg total) by mouth every 6 (six) hours as needed for mild pain (or Fever >/= 101).    . busPIRone (BUSPAR) 10 MG tablet Take 10 mg by mouth 3 (three) times daily.    . cetirizine (ZYRTEC) 10 MG tablet Take 1 tablet (10 mg total) by mouth at bedtime. 30 tablet 2  . chlorproMAZINE (THORAZINE) 25 MG tablet Take by mouth.    . Cholecalciferol (VITAMIN D3) 2000 units capsule Take 2,000 Units by mouth daily.    . clonazePAM (KLONOPIN) 0.5 MG tablet Take 0.5 mg by mouth 3 (three) times daily.    . divalproex (DEPAKOTE) 250 MG DR tablet Take 3 tablets (750 mg total) by mouth daily at 8 pm. 90 tablet 0  . fluticasone (FLONASE) 50 MCG/ACT nasal spray Place 4 sprays into both nostrils daily. 16 g 4  . medroxyPROGESTERone (DEPO-PROVERA) 150 MG/ML injection Inject 1 mL (150 mg total) into the muscle every 3 (three) months. 1 mL 0  . medroxyPROGESTERone Acetate 150 MG/ML SUSY INJECT IM ONCE EVERY 3 MONTHS. 1 mL 0  . Melatonin 10 MG TABS Take by mouth.    Marland Kitchen omeprazole (PRILOSEC) 40 MG capsule TAKE (1) CAPSULE DAILY. (Patient taking differently:  Take 40 mg by mouth daily after breakfast. ) 90 capsule 3  . polyethylene glycol powder (GLYCOLAX/MIRALAX) powder 1 cap full daily in a beverage in the morning (Patient taking differently: Take 17 g by mouth 3 (three) times a week. ) 3350 g 2  . prazosin (MINIPRESS) 2 MG capsule Take by mouth.    . sertraline (ZOLOFT) 100 MG tablet Take 100 mg by mouth daily.     . vitamin C (ASCORBIC ACID) 250 MG tablet Take 500 mg by mouth daily.    . diazepam (DIASTAT) 2.5 MG GEL Place 2.5 mg rectally once for 1 dose. (Patient taking differently: Place 2.5 mg rectally once. Take PRN) 2.5 mg 2  . guanFACINE (INTUNIV) 2 MG TB24 ER tablet Take 2 mg by mouth daily.    . metoprolol tartrate  (LOPRESSOR) 25 MG tablet Take 0.5 tablets (12.5 mg total) by mouth daily for 30 days. 30 tablet 1  . naltrexone (DEPADE) 50 MG tablet Take 25 mg by mouth daily.     Marland Kitchen topiramate (TOPAMAX) 50 MG tablet Take 50 mg by mouth 2 (two) times daily.    . ziprasidone (GEODON) 60 MG capsule Take 60 mg by mouth daily.     No current facility-administered medications on file prior to visit.   ROS as in subjective   Objective: Ht 4\' 11"  (1.499 m)   Wt 152 lb (68.9 kg)   BMI 30.70 kg/m   Not seen in person as this was virtual consult.   Assessment: Encounter Diagnoses  Name Primary?  . Acute non-recurrent frontal sinusitis Yes  . Polypharmacy   . High risk medication use      Plan: We discussed concerns, symptoms.  Begin amoxicillin for possible sinus infection.  Discussed hydration and rest.  Consider Benadryl or Mucinex instead of Sudafed so Sudafed can raise blood pressure.  Advise she get Covid tested just to make sure since the symptoms can overlap, particular given that we are coming off of Christmas holiday where a lot of people or around other people in close quarters.  If symptoms change or new symptoms arise the next few days call back.  If much worse in the coming days or over the weekend, then get reevaluated.  Discussed quarantine for now just in case this could be covid.    Kellan was seen today for sinus problem.  Diagnoses and all orders for this visit:  Acute non-recurrent frontal sinusitis  Polypharmacy  High risk medication use  Other orders -     amoxicillin (AMOXIL) 875 MG tablet; Take 1 tablet (875 mg total) by mouth 2 (two) times daily.

## 2019-05-18 NOTE — Patient Instructions (Signed)
Your symptoms today suggests sinus infection, but I can't completely rule out Covid infection.    Covid symptoms can include fever, tiredness, body aches, cough, sore throat, diarrhea, headache, loss of taste or smell, shortness of breath, rash, and discoloration of fingers or toes.    Risk factors that may put someone at risk for worse outcome with covid infection includes older than 30 years old, underlying health conditions like COPD, heart failure, asthma, diabetes, hypertension, kidney disease, obesity, and history of heart disease or stroke.  Currently your symptoms seem mild.     General recommendations: I recommend you rest, hydrate well with water and clear fluids throughout the day.   Drink enough water and clear fluids so that your urine is clear.   Begin Amoxicillin antibiotic.  You can use Tylenol for pain or fever every 4-6 hours. You can use over the counter Delsym for cough. You can use over the counter Emetrol for nausea.    Consider using over the counter Emergen-C Immune + over the counter for the next week. Consider Benadryl or Mucinex instead of Sudafed since Sudafed can raise blood pressure.  Consider Covid testing.    Covid testing:  [place] testing: Schedule your test at one of the [place] testing sites online or by text:  Online: Go to [email or web address].com.pt   or   [employer]ing[employer].es, and schedule appointment or review other testing information on the web page  Text: Text the word "COVID" to [id number]        Self Quarantine: The CDC, Centers for Disease Control has recommended a self quarantine of 10 -14 days from the start of your illness until you are symptom-free including at least 24 hours of no symptoms including no fever, no shortness of breath, and no body aches and chills, by day 10 before returning to work or general contact with the public.  What does self quarantine mean: avoiding contact with people as much as  possible.   Particularly in your house, isolate your self from others in a separate room, wear a mask when possible in the room, particularly if coughing a lot.   Have others bring food, water, medications, etc., to your door, but avoid direct contact with your household contacts during this time to avoid spreading the infection to them.   If you have a separate bathroom and living quarters during the next 2 weeks away from others, that would be preferable.    If you can't completely isolate, then wear a mask, wash hands frequently with soap and water for at least 15 seconds, minimize close contact with others, and have a friend or family member check regularly from a distance to make sure you are not getting seriously worse.     You should not be going out in public, should not be going to stores, to work or other public places until all your symptoms have resolved and at least 10 days + 24 hours of no symptoms at all have transpired.   Ideally you should avoid contact with others for a full 10 days if possible.  One of the goals is to limit spread to high risk people; people that are older and elderly, people with multiple health issues like diabetes, heart disease, lung disease, and anybody that has weakened immune systems such as people with cancer or on immunosuppressive therapy.

## 2019-06-01 ENCOUNTER — Telehealth (INDEPENDENT_AMBULATORY_CARE_PROVIDER_SITE_OTHER): Payer: Medicaid Other | Admitting: Neurology

## 2019-06-01 ENCOUNTER — Other Ambulatory Visit: Payer: Self-pay

## 2019-06-01 ENCOUNTER — Encounter: Payer: Self-pay | Admitting: Neurology

## 2019-06-01 VITALS — Ht 59.0 in | Wt 152.0 lb

## 2019-06-01 DIAGNOSIS — Q8719 Other congenital malformation syndromes predominantly associated with short stature: Secondary | ICD-10-CM

## 2019-06-01 DIAGNOSIS — R569 Unspecified convulsions: Secondary | ICD-10-CM | POA: Diagnosis not present

## 2019-06-01 DIAGNOSIS — F989 Unspecified behavioral and emotional disorders with onset usually occurring in childhood and adolescence: Secondary | ICD-10-CM | POA: Diagnosis not present
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
## 2019-07-19 ENCOUNTER — Ambulatory Visit: Payer: Medicaid Other | Attending: Internal Medicine

## 2019-07-19 DIAGNOSIS — Z20822 Contact with and (suspected) exposure to covid-19: Secondary | ICD-10-CM

## 2019-07-20 ENCOUNTER — Telehealth: Payer: Self-pay | Admitting: Medical

## 2019-07-20 LAB — NOVEL CORONAVIRUS, NAA: SARS-CoV-2, NAA: DETECTED — AB

## 2019-07-20 NOTE — Telephone Encounter (Signed)
Please call mom about these general recommendations.  Also, she may be eligible for infusion therapy, a special treatment for people at higher risk with covid.   If they want to pursue this as well to try and limit potential complications of covid, we can refer.   General recommendations: I recommend you rest, hydrate well with water and clear fluids throughout the day.   You can use Tylenol for pain or fever You can use over the counter Delsym for cough. You can use over the counter Emetrol for nausea.     If you are having trouble breathing, if you are very weak, have high fever 103 or higher consistently despite Tylenol, or uncontrollable nausea and vomiting, then call or go to the emergency department.    If you have other questions or have other symptoms or questions you are concerned about then please make a virtual visit  Covid symptoms such as fatigue and cough can linger over 2 weeks, even after the initial fever, aches, chills, and other initial symptoms.   Self Quarantine: The CDC, Centers for Disease Control has recommended a self quarantine of 10 days from the start of your illness until you are symptom-free including at least 24 hours of no symptoms including no fever, no shortness of breath, and no body aches and chills, by day 10 before returning to work or general contact with the public.

## 2019-07-20 NOTE — Telephone Encounter (Signed)
Pt's mother called and states pt tested positive for COVID. She is doing ok. Rand a temp yesterday but no temp today. She is tired and doing a lot of resting. Mother can be reached at [phone].

## 2019-07-21 NOTE — Telephone Encounter (Signed)
Message has been sent on mychart as it was ok per mom to send on mychart

## 2019-07-26 ENCOUNTER — Encounter (HOSPITAL_COMMUNITY): Payer: Self-pay | Admitting: Emergency Medicine

## 2019-07-26 ENCOUNTER — Other Ambulatory Visit: Payer: Self-pay

## 2019-07-26 ENCOUNTER — Emergency Department (HOSPITAL_COMMUNITY): Payer: Medicaid Other

## 2019-07-26 ENCOUNTER — Emergency Department (HOSPITAL_COMMUNITY)
Admission: EM | Admit: 2019-07-26 | Discharge: 2019-07-26 | Disposition: A | Discharge status: Home / Self Care | Payer: Medicaid Other | Attending: Emergency Medicine | Admitting: Emergency Medicine

## 2019-07-26 DIAGNOSIS — Q8719 Other congenital malformation syndromes predominantly associated with short stature: Secondary | ICD-10-CM | POA: Diagnosis not present

## 2019-07-26 DIAGNOSIS — Z79899 Other long term (current) drug therapy: Secondary | ICD-10-CM | POA: Diagnosis not present

## 2019-07-26 DIAGNOSIS — U071 COVID-19: Secondary | ICD-10-CM | POA: Insufficient documentation

## 2019-07-26 DIAGNOSIS — F79 Unspecified intellectual disabilities: Secondary | ICD-10-CM | POA: Insufficient documentation

## 2019-07-26 DIAGNOSIS — J1282 Pneumonia due to coronavirus disease 2019: Secondary | ICD-10-CM | POA: Diagnosis not present

## 2019-07-26 DIAGNOSIS — R0602 Shortness of breath: Secondary | ICD-10-CM | POA: Insufficient documentation

## 2019-07-26 LAB — CBC WITH DIFFERENTIAL/PLATELET
Abs Immature Granulocytes: 0.24 10*3/uL — ABNORMAL HIGH (ref 0.00–0.07)
Basophils Absolute: 0.1 10*3/uL (ref 0.0–0.1)
Basophils Relative: 1 %
Eosinophils Absolute: 0 10*3/uL (ref 0.0–0.5)
Eosinophils Relative: 1 %
HCT: 37.5 % (ref 36.0–46.0)
Hemoglobin: 12.1 g/dL (ref 12.0–15.0)
Immature Granulocytes: 5 %
Lymphocytes Relative: 35 %
Lymphs Abs: 1.8 10*3/uL (ref 0.7–4.0)
MCH: 31 pg (ref 26.0–34.0)
MCHC: 32.3 g/dL (ref 30.0–36.0)
MCV: 96.2 fL (ref 80.0–100.0)
Monocytes Absolute: 0.5 10*3/uL (ref 0.1–1.0)
Monocytes Relative: 11 %
Neutro Abs: 2.3 10*3/uL (ref 1.7–7.7)
Neutrophils Relative %: 47 %
Platelets: 168 10*3/uL (ref 150–400)
RBC: 3.9 MIL/uL (ref 3.87–5.11)
RDW: 11.8 % (ref 11.5–15.5)
WBC: 4.9 10*3/uL (ref 4.0–10.5)
nRBC: 0 % (ref 0.0–0.2)

## 2019-07-26 LAB — COMPREHENSIVE METABOLIC PANEL
ALT: 11 U/L (ref 0–44)
AST: 18 U/L (ref 15–41)
Albumin: 2.9 g/dL — ABNORMAL LOW (ref 3.5–5.0)
Alkaline Phosphatase: 48 U/L (ref 38–126)
Anion gap: 14 (ref 5–15)
BUN: 9 mg/dL (ref 6–20)
CO2: 23 mmol/L (ref 22–32)
Calcium: 8.3 mg/dL — ABNORMAL LOW (ref 8.9–10.3)
Chloride: 104 mmol/L (ref 98–111)
Creatinine, Ser: 0.8 mg/dL (ref 0.44–1.00)
GFR calc Af Amer: >60 mL/min (ref >60)
GFR calc non Af Amer: >60 mL/min (ref >60)
Glucose, Bld: 115 mg/dL — ABNORMAL HIGH (ref 70–99)
Potassium: 3.7 mmol/L (ref 3.5–5.1)
Sodium: 141 mmol/L (ref 135–145)
Total Bilirubin: 0.6 mg/dL (ref 0.3–1.2)
Total Protein: 6.5 g/dL (ref 6.5–8.1)

## 2019-07-26 LAB — URINALYSIS, ROUTINE W REFLEX MICROSCOPIC
Bilirubin Urine: NEGATIVE
Glucose, UA: NEGATIVE mg/dL
Hgb urine dipstick: NEGATIVE
Ketones, ur: NEGATIVE mg/dL
Leukocytes,Ua: NEGATIVE
Nitrite: NEGATIVE
Protein, ur: NEGATIVE mg/dL
Specific Gravity, Urine: 1.019 (ref 1.005–1.030)
pH: 6 (ref 5.0–8.0)

## 2019-07-26 LAB — LACTIC ACID, PLASMA: Lactic Acid, Venous: 1.9 mmol/L (ref 0.5–1.9)

## 2019-07-26 LAB — I-STAT BETA HCG BLOOD, ED (MC, WL, AP ONLY): I-stat hCG, quantitative: <5 m[IU]/mL (ref <5)

## 2019-07-26 MED ORDER — AZITHROMYCIN 250 MG PO TABS: 250.0000 mg | ORAL_TABLET | Freq: Every day | ORAL | 0 refills | Status: DC

## 2019-07-26 MED ORDER — AMOXICILLIN 500 MG PO CAPS: 1000.0000 mg | ORAL_CAPSULE | Freq: Two times a day (BID) | ORAL | 0 refills | Status: DC

## 2019-07-26 MED ORDER — SODIUM CHLORIDE 0.9% FLUSH: 3.0000 mL | Freq: Once | INTRAVENOUS | Status: DC
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
## 2019-07-26 NOTE — ED Triage Notes (Signed)
Per Mother, pt tested positive for COVID 7 days ago.  She had been getting better however tonight she became restless and appears to be SOB.  She has a non-productive cough.

## 2019-07-26 NOTE — Discharge Instructions (Signed)
Return if her breathing is getting worse.

## 2019-07-26 NOTE — ED Notes (Signed)
Pt ambulated with pulse ox. SpO2 maintained at 92% or greater with ambulation.

## 2019-07-26 NOTE — ED Notes (Signed)
Patient verbalizes understanding of discharge instructions. Opportunity for questioning and answers were provided. Armband removed by staff, pt discharged from ED.  

## 2019-08-01 ENCOUNTER — Other Ambulatory Visit: Payer: Self-pay | Admitting: Medical

## 2019-08-02 ENCOUNTER — Telehealth: Payer: Self-pay | Admitting: Internal Medicine

## 2019-08-02 ENCOUNTER — Other Ambulatory Visit: Payer: Self-pay | Admitting: Internal Medicine

## 2019-08-02 MED ORDER — MEDROXYPROGESTERONE ACETATE 150 MG/ML IM SUSP: 150.0000 mg | INTRAMUSCULAR | 0 refills | Status: DC

## 2019-08-02 NOTE — Progress Notes (Unsigned)
Mom called and last depo was 04/26/19. She gets depo at home

## 2019-08-02 NOTE — Telephone Encounter (Signed)
[facility] Pt's mother gave verbal to take daphne off hipaa and put cyndi on her hipaa. I advised her that I would have to have her sign a new one and I will send it through cyndi to get filled out.

## 2019-08-04 NOTE — Telephone Encounter (Signed)
Call and see if mom wants to do a virtual to check up on Dana Mcneil's recent covid diagnosis.   I meant to respond earlier to the hospital report  where she was seen, but due to death in my family, I didn't get a chance to respond sooner.  I hope she is improving, but wanted to check.

## 2019-09-08 ENCOUNTER — Telehealth: Payer: Self-pay | Admitting: Medical

## 2019-09-08 NOTE — Telephone Encounter (Signed)
Please call family, I received a large packet of records from I believe [facility] start  I do need you to read over the plan section and update her medication in her chart per the report.  I placed a sticky note on that section  I am not sure if this is more of records for our review or if there is something in here this was the place in order.  I am not sure  Is the family waiting on some type of order?  Please verify who is she seeing for psychiatry currently, counts are currently, and is she still living at home with mother and father?

## 2019-09-08 NOTE — Telephone Encounter (Signed)
Referral is needed for Gastro to make sure she does not have ulcers or any type of gastro problems because it's common for people with special needs. Wants referral to [facility] She is not currently seeing psychiatry due to worsening symptoms. They see Dr. Betti Cruz and [facility] for medication management. She is currently in respite and has been accepted to [facility] but not sure of her beginning date. Please advise further.   Mother can be reached at [phone]

## 2019-09-09 NOTE — Telephone Encounter (Signed)
Mom stated that they were recommended to [facility].

## 2019-09-09 NOTE — Telephone Encounter (Signed)
Ok, then refer.   Is there a reason they don't want to see a GI here in [place].  We have several good GI doctors.  We can refer to [facility], but there are plenty in [place].

## 2019-09-09 NOTE — Telephone Encounter (Signed)
Pt mom stated the information for [facility] is in the paperwork. Please provider that information so I can refer.

## 2019-09-09 NOTE — Telephone Encounter (Signed)
Refer , thanks

## 2019-09-15 NOTE — Telephone Encounter (Signed)
I havent had a chance to thoroughly review the 2 stacks of forms on my desk on right.   If possible look for info in the forms about referral request.

## 2019-09-20 ENCOUNTER — Other Ambulatory Visit: Payer: Self-pay | Admitting: Medical

## 2019-09-20 MED ORDER — HYDROXYZINE HCL 50 MG PO TABS: 50.0000 mg | ORAL_TABLET | Freq: Four times a day (QID) | ORAL | 0 refills | Status: AC | PRN

## 2019-09-20 MED ORDER — BISACODYL 10 MG RE SUPP: 10.0000 mg | RECTAL | 0 refills | Status: AC | PRN

## 2019-09-20 MED ORDER — GABAPENTIN 300 MG PO CAPS: 300.0000 mg | ORAL_CAPSULE | Freq: Two times a day (BID) | ORAL | 0 refills | Status: AC

## 2019-09-20 MED ORDER — METOPROLOL SUCCINATE ER 25 MG PO TB24: 25.0000 mg | ORAL_TABLET | Freq: Every day | ORAL | 0 refills | Status: AC

## 2019-09-20 MED ORDER — BENZTROPINE MESYLATE 0.5 MG PO TABS: 0.5000 mg | ORAL_TABLET | Freq: Two times a day (BID) | ORAL | 0 refills | Status: AC

## 2019-09-20 MED ORDER — DIVALPROEX SODIUM 500 MG PO DR TAB: 500.0000 mg | DELAYED_RELEASE_TABLET | Freq: Two times a day (BID) | ORAL | 0 refills | Status: AC

## 2019-09-20 MED ORDER — PANTOPRAZOLE SODIUM 40 MG PO TBEC: 40.0000 mg | DELAYED_RELEASE_TABLET | Freq: Every day | ORAL | 0 refills | Status: AC

## 2019-09-20 MED ORDER — MELATONIN 3 MG PO TABS: 9.0000 mg | ORAL_TABLET | Freq: Every day | ORAL | 0 refills | Status: AC

## 2019-09-20 MED ORDER — POLYETHYLENE GLYCOL 3350 17 G PO PACK: 17.0000 g | PACK | Freq: Every day | ORAL | 0 refills | Status: DC

## 2019-09-20 MED ORDER — ASENAPINE MALEATE 5 MG SL SUBL: SUBLINGUAL_TABLET | SUBLINGUAL | 0 refills | Status: AC

## 2019-09-20 MED ORDER — CHLORPROMAZINE HCL 50 MG PO TABS: 50.0000 mg | ORAL_TABLET | Freq: Two times a day (BID) | ORAL | 0 refills | Status: AC

## 2019-09-20 NOTE — Telephone Encounter (Signed)
Can you review the notes and inform of GI in [place]

## 2019-09-20 NOTE — Telephone Encounter (Signed)
I have reviewed chart records.  Please set up virtual with mom to discuss, virtual consult needed.  From the review of records, I see where they were trying to place [facility] with [facility].   This would be handled by the hospital social worker where she was at prior, [facility].    I don't see anything about GI consult/referral.  So lets do virtual, find out what else they need as far as referral?  I updated all medications in chart/reconciled.

## 2019-09-21 NOTE — Telephone Encounter (Signed)
Based on recent hospitalization record, they have her listed on Metoprolol SUCCINATE XL 25mg  daily, not what you sent me shane

## 2019-09-21 NOTE — Telephone Encounter (Signed)
Should patient be taking 1 tablet daily or 1/2 tablet daily?

## 2019-09-21 NOTE — Telephone Encounter (Signed)
I spoke to [place] about a phone call she had with mom this morning.  Mom was upset about things not getting handled  At this point I have not spoken to mom or had a consult with mom or [place], but I have actually reviewed to recent emergency department visit notes related to Covid, the large packet of things that came in from recent visit at [facility].  So I am not exactly sure what mom needs, which is why I requested the visit to discuss the concerns which is reasonable.  There was a prior phone call about a referral to GI with no contact information where to refer her.  If she has a specific gastroenterologist in the [facility] system and she wants [place] to use then let us know.  Otherwise I do not have a random gastroenterologist to refer to.  I got the impression they had somebody in mind.  The main finding I saw in the report from [facility] was that she was being referred to another facility as a potential residential facility from this [facility] visit.  This would have been handled by the hospital not [place]

## 2019-09-23 NOTE — Telephone Encounter (Signed)
error 

## 2019-09-26 NOTE — Telephone Encounter (Signed)
Patient has been referred to Dr. Maren Beach with [facility]. It is also recommended for patient to see [facility]. Patient mother has been advised that a virtual appointment is needed.

## 2019-09-28 ENCOUNTER — Encounter: Payer: Self-pay | Admitting: Medical

## 2019-09-28 ENCOUNTER — Other Ambulatory Visit: Payer: Self-pay

## 2019-09-28 ENCOUNTER — Telehealth (INDEPENDENT_AMBULATORY_CARE_PROVIDER_SITE_OTHER): Payer: Medicaid Other | Admitting: Medical

## 2019-09-28 ENCOUNTER — Telehealth: Payer: Self-pay | Admitting: Medical

## 2019-09-28 VITALS — Ht 59.0 in

## 2019-09-28 DIAGNOSIS — F4310 Post-traumatic stress disorder, unspecified: Secondary | ICD-10-CM

## 2019-09-28 DIAGNOSIS — R4689 Other symptoms and signs involving appearance and behavior: Secondary | ICD-10-CM

## 2019-09-28 DIAGNOSIS — K5900 Constipation, unspecified: Secondary | ICD-10-CM

## 2019-09-28 DIAGNOSIS — R1313 Dysphagia, pharyngeal phase: Secondary | ICD-10-CM | POA: Diagnosis not present

## 2019-09-28 DIAGNOSIS — K219 Gastro-esophageal reflux disease without esophagitis: Secondary | ICD-10-CM

## 2019-09-28 DIAGNOSIS — F79 Unspecified intellectual disabilities: Secondary | ICD-10-CM

## 2019-09-28 DIAGNOSIS — Q8719 Other congenital malformation syndromes predominantly associated with short stature: Secondary | ICD-10-CM | POA: Diagnosis not present

## 2019-09-28 DIAGNOSIS — Z79899 Other long term (current) drug therapy: Secondary | ICD-10-CM

## 2019-09-28 DIAGNOSIS — R1310 Dysphagia, unspecified: Secondary | ICD-10-CM | POA: Diagnosis not present

## 2019-09-28 DIAGNOSIS — R140 Abdominal distension (gaseous): Secondary | ICD-10-CM

## 2019-09-28 DIAGNOSIS — Q899 Congenital malformation, unspecified: Secondary | ICD-10-CM

## 2019-09-28 DIAGNOSIS — Z91419 Personal history of unspecified adult abuse: Secondary | ICD-10-CM

## 2019-09-28 DIAGNOSIS — R454 Irritability and anger: Secondary | ICD-10-CM

## 2019-09-28 NOTE — Telephone Encounter (Signed)
Please make referral to:  [facility]  Exchange on [address] (formerly [place] [facility] [place]) [address], [address] [place], [place] [address]  For Clinical Appointments Please call [phone]   RE: this is for psychiatric evaluation (not autism).   She has hx/o [name], behavior concerns, PTSD, has had several recent ED visits/hospital visits for behavior concerns, and is in emergency respite care.
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
## 2019-09-29 NOTE — Telephone Encounter (Signed)
Tried calling [facility] and they no one is currently available.

## 2019-10-04 NOTE — Telephone Encounter (Signed)
Tried calling the center again. Left message for someone to return my call about scheduling a new patient appointment.

## 2019-10-04 NOTE — Telephone Encounter (Signed)
Continue your efforts

## 2019-10-06 NOTE — Telephone Encounter (Signed)
Tried calling again, got the same message that no one is available to take my call. I reached out to mom to see if she has been able to reach them. Mom has asked for the number and it has been provided. She stated she will try to call as well.

## 2019-10-10 ENCOUNTER — Encounter: Payer: Self-pay | Admitting: Neurology

## 2019-10-10 ENCOUNTER — Other Ambulatory Visit: Payer: Self-pay

## 2019-10-10 ENCOUNTER — Ambulatory Visit (INDEPENDENT_AMBULATORY_CARE_PROVIDER_SITE_OTHER): Payer: Medicaid Other | Admitting: Neurology

## 2019-10-10 ENCOUNTER — Emergency Department (HOSPITAL_COMMUNITY)
Admission: EM | Admit: 2019-10-10 | Discharge: 2019-10-10 | Disposition: A | Discharge status: Home / Self Care | Payer: Medicaid Other | Attending: Emergency Medicine | Admitting: Emergency Medicine

## 2019-10-10 ENCOUNTER — Ambulatory Visit (HOSPITAL_COMMUNITY): Admission: RE | Admit: 2019-10-10 | Payer: Medicaid Other | Source: Ambulatory Visit

## 2019-10-10 ENCOUNTER — Emergency Department (HOSPITAL_COMMUNITY): Payer: Medicaid Other

## 2019-10-10 ENCOUNTER — Encounter (HOSPITAL_COMMUNITY): Payer: Self-pay

## 2019-10-10 VITALS — BP 110/80 | HR 119 | Resp 18 | Wt 142.0 lb

## 2019-10-10 DIAGNOSIS — F989 Unspecified behavioral and emotional disorders with onset usually occurring in childhood and adolescence: Secondary | ICD-10-CM | POA: Diagnosis not present

## 2019-10-10 DIAGNOSIS — Z79899 Other long term (current) drug therapy: Secondary | ICD-10-CM | POA: Insufficient documentation

## 2019-10-10 DIAGNOSIS — Y998 Other external cause status: Secondary | ICD-10-CM | POA: Insufficient documentation

## 2019-10-10 DIAGNOSIS — S0990XA Unspecified injury of head, initial encounter: Secondary | ICD-10-CM | POA: Insufficient documentation

## 2019-10-10 DIAGNOSIS — R4182 Altered mental status, unspecified: Secondary | ICD-10-CM

## 2019-10-10 DIAGNOSIS — Y9289 Other specified places as the place of occurrence of the external cause: Secondary | ICD-10-CM | POA: Diagnosis not present

## 2019-10-10 DIAGNOSIS — R569 Unspecified convulsions: Secondary | ICD-10-CM

## 2019-10-10 DIAGNOSIS — Q8719 Other congenital malformation syndromes predominantly associated with short stature: Secondary | ICD-10-CM

## 2019-10-10 DIAGNOSIS — M542 Cervicalgia: Secondary | ICD-10-CM | POA: Insufficient documentation

## 2019-10-10 DIAGNOSIS — R531 Weakness: Secondary | ICD-10-CM | POA: Insufficient documentation

## 2019-10-10 DIAGNOSIS — Y9389 Activity, other specified: Secondary | ICD-10-CM | POA: Insufficient documentation

## 2019-10-10 DIAGNOSIS — R5383 Other fatigue: Secondary | ICD-10-CM | POA: Diagnosis not present

## 2019-10-10 DIAGNOSIS — W228XXA Striking against or struck by other objects, initial encounter: Secondary | ICD-10-CM | POA: Diagnosis not present

## 2019-10-10 DIAGNOSIS — R414 Neurologic neglect syndrome: Secondary | ICD-10-CM | POA: Diagnosis not present

## 2019-10-10 LAB — COMPREHENSIVE METABOLIC PANEL
ALT: 13 U/L (ref 0–44)
AST: 14 U/L — ABNORMAL LOW (ref 15–41)
Albumin: 3.8 g/dL (ref 3.5–5.0)
Alkaline Phosphatase: 46 U/L (ref 38–126)
Anion gap: 8 (ref 5–15)
BUN: 18 mg/dL (ref 6–20)
CO2: 25 mmol/L (ref 22–32)
Calcium: 8.5 mg/dL — ABNORMAL LOW (ref 8.9–10.3)
Chloride: 106 mmol/L (ref 98–111)
Creatinine, Ser: 0.92 mg/dL (ref 0.44–1.00)
GFR calc Af Amer: >60 mL/min (ref >60)
GFR calc non Af Amer: >60 mL/min (ref >60)
Glucose, Bld: 86 mg/dL (ref 70–99)
Potassium: 4.2 mmol/L (ref 3.5–5.1)
Sodium: 139 mmol/L (ref 135–145)
Total Bilirubin: 0.7 mg/dL (ref 0.3–1.2)
Total Protein: 6.6 g/dL (ref 6.5–8.1)

## 2019-10-10 LAB — CBC WITH DIFFERENTIAL/PLATELET
Abs Immature Granulocytes: 0.11 10*3/uL — ABNORMAL HIGH (ref 0.00–0.07)
Basophils Absolute: 0 10*3/uL (ref 0.0–0.1)
Basophils Relative: 1 %
Eosinophils Absolute: 0.1 10*3/uL (ref 0.0–0.5)
Eosinophils Relative: 1 %
HCT: 36.4 % (ref 36.0–46.0)
Hemoglobin: 12 g/dL (ref 12.0–15.0)
Immature Granulocytes: 2 %
Lymphocytes Relative: 38 %
Lymphs Abs: 2.3 10*3/uL (ref 0.7–4.0)
MCH: 31.8 pg (ref 26.0–34.0)
MCHC: 33 g/dL (ref 30.0–36.0)
MCV: 96.6 fL (ref 80.0–100.0)
Monocytes Absolute: 0.7 10*3/uL (ref 0.1–1.0)
Monocytes Relative: 12 %
Neutro Abs: 2.8 10*3/uL (ref 1.7–7.7)
Neutrophils Relative %: 46 %
Platelets: 162 10*3/uL (ref 150–400)
RBC: 3.77 MIL/uL — ABNORMAL LOW (ref 3.87–5.11)
RDW: 12.6 % (ref 11.5–15.5)
WBC: 6.1 10*3/uL (ref 4.0–10.5)
nRBC: 0 % (ref 0.0–0.2)

## 2019-10-10 NOTE — ED Notes (Signed)
Phlebotomy called.  
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
## 2019-10-10 NOTE — ED Triage Notes (Addendum)
Patient bangs her frequently and 5 days ago she banged her head without her helmet on. Patient's mother states they had an order for outpatient Head CT, but had not been approved. Patient's mother states she has not gotten back to her baseline since the incident 5 days ago. Patient's mother states that the patient needs help walking which is not normal, leaning to the right side, and speech is different.

## 2019-10-10 NOTE — ED Notes (Signed)
Attempted to perform NIH stroke scale, unable to get accurate result due to pts developmental disabilities.

## 2019-10-10 NOTE — ED Notes (Signed)
Unable to draw blood before pt went to CT. Will call phlebotomy on pts return.

## 2019-10-11 ENCOUNTER — Ambulatory Visit (HOSPITAL_COMMUNITY): Admission: RE | Admit: 2019-10-11 | Payer: Medicaid Other | Source: Ambulatory Visit

## 2019-10-11 ENCOUNTER — Telehealth: Payer: Self-pay | Admitting: Neurology

## 2019-10-11 NOTE — Telephone Encounter (Signed)
Message was left with [employer]:  "Caller states she needs authorization for insurance on a pt."  The on call provider Karel Jarvis) was paged, provided with patient information and warm transferred her to the office.

## 2019-10-11 NOTE — Telephone Encounter (Signed)
Pt mother called no answer left a voice mail for her to call the office back also informed her that Luria needs to be at [facility] by 9:45am today to have CT scan done,

## 2019-10-11 NOTE — Telephone Encounter (Signed)
Patients mother called to let the office know that they took the patient to the ER to have the CT scan done so they don't need the orders put in any more. If you have any questions you can call her back.

## 2019-10-11 NOTE — Telephone Encounter (Signed)
Message sent to [facility] yesterday and this morning waiting to hear back

## 2019-10-11 NOTE — Telephone Encounter (Signed)
Message left with [employer]:  "Caller states that her niece was in the office earlier and they are at the hospital and the office was suppose to send over orders for her to have a scan done and they have not. She bangs her head and is acting different. The aunt was calling for the mother because she works in the same building as the Neurology office."

## 2019-10-31 ENCOUNTER — Telehealth: Payer: Self-pay | Admitting: Family Medicine

## 2019-10-31 NOTE — Telephone Encounter (Signed)
I discussed with you Dana Mcneil in office and we clarified diagnosis codes to use.   Not sure if any other reason for delays.  The original paperwork was complicated which added to some delays.

## 2019-10-31 NOTE — Telephone Encounter (Signed)
Mom called, very concerned that we do not have the referral yet for [facility], she states they have sent a request back to [place] twice for more information.  She states pt is very sick, not swallowing food, lots of belly pain.  Mom doesn't understand the delay.  Mom gave me phone# for [facility], she doesn't know the person's name that she has been working with.  I called [facility] [phone] spoke with an RN named Shawn, he states he can see all of the information that we keep sending to them, but it is not attaching to their referral.  I asked him could that be the delay and he said yes.  He continued to work on moving the info while I held.  He said he would work on this and call me back shortly with referral update.  I gave him my work and cell number.  I will advise mom as soon as I know more.

## 2019-10-31 NOTE — Telephone Encounter (Signed)
I have worked on this all day trying to get the 41 pages to [facility].  Sent by email to the name on the referral, he advised he is no longer in referrals and could not forward it to referrals.  Called TAZ in referrals asked for another fax number or email, she said there were not any other.  She said they only needed the dx codes.  I advised I could give her those over the phone and her boss would not allow.  She said even when she gets the info it will be August or September before they can get her in.  If pt needs care before that she will need to go to emergency room.  I called mom as earlier she said she would consider referral to Dr. Kenna Gilbert office.  I explained all the above info and she said ok for Dr. Trilby Drummer office.  I called Dr. Kenna Gilbert office t/w Misty Stanley, told her I needed to pull some strings and get this pt in with Dr Loreta Ave.  Misty Stanley said Dr Loreta Ave not taking any new patients.  She would give to Dr. Elnoria Howard.  I faxed info over to him.

## 2019-11-01 ENCOUNTER — Telehealth: Payer: Self-pay

## 2019-11-01 ENCOUNTER — Telehealth: Payer: Self-pay | Admitting: Family Medicine

## 2019-11-01 NOTE — Telephone Encounter (Signed)
I received a fax from [facility] for a refill on the pts. Medroxyprogesterone 150mg /ml pt. Last apt was 09/28/19.

## 2019-11-01 NOTE — Telephone Encounter (Signed)
Thanks for working on this.  I don't think mother understands the difficulty in a referral to a place like [facility], when it is so easy to just send right down the road to Dr. Elnoria Howard.

## 2019-11-01 NOTE — Telephone Encounter (Signed)
Called pt mom to give her an update:  I just received a call from Dr Kenna Gilbert office that they could not accept Oleva.  I gave Dana Mcneil (mom) the [facility], she said she had missed a call from them and would call them back.  I explained we are hopeful to have an answer by end of Thursday.  Dana Mcneil will let me know if [facility] needs anything further.

## 2019-11-01 NOTE — Telephone Encounter (Signed)
Taz with [employer] called me back yesterday afternoon and gave me an email address to send everything to.  [email or web address].  Today Richard with [facility] called and obtained the Medicaid ID [id number].  He ran the coverage query through while we were on the phone.  Richard states it now goes onto his spreadsheet and will go to a provider on Wednesday to review and then on Thursday to the review board and we would have an answer Thursday rather they were going to accept her or not.  I also have a req to Dr. Kenna Gilbert office for this same patient.  Keeping both request out there to see who can get her in first.  Dr.Mann is not accepting any new patients, so Dr. Elnoria Howard will review.

## 2019-11-03 ENCOUNTER — Other Ambulatory Visit: Payer: Self-pay | Admitting: Medical

## 2019-11-10 ENCOUNTER — Telehealth: Payer: Self-pay | Admitting: Family Medicine

## 2019-11-10 DIAGNOSIS — Z0289 Encounter for other administrative examinations: Secondary | ICD-10-CM

## 2019-11-10 NOTE — Telephone Encounter (Signed)
[facility] called [facility] and advised pt has been accepted for a consult.  [facility] will let mom know. I will also advise mom.

## 2019-11-10 NOTE — Telephone Encounter (Signed)
thx

## 2019-11-23 ENCOUNTER — Ambulatory Visit (INDEPENDENT_AMBULATORY_CARE_PROVIDER_SITE_OTHER): Payer: Medicaid Other | Admitting: Medical

## 2019-11-23 ENCOUNTER — Encounter: Payer: Self-pay | Admitting: Medical

## 2019-11-23 ENCOUNTER — Other Ambulatory Visit: Payer: Self-pay

## 2019-11-23 VITALS — BP 110/80 | HR 70 | Temp 98.0°F | Ht 59.5 in | Wt 137.8 lb

## 2019-11-23 DIAGNOSIS — M545 Low back pain, unspecified: Secondary | ICD-10-CM

## 2019-11-23 DIAGNOSIS — R1310 Dysphagia, unspecified: Secondary | ICD-10-CM

## 2019-11-23 DIAGNOSIS — Z79899 Other long term (current) drug therapy: Secondary | ICD-10-CM

## 2019-11-23 DIAGNOSIS — M542 Cervicalgia: Secondary | ICD-10-CM

## 2019-11-23 DIAGNOSIS — W19XXXA Unspecified fall, initial encounter: Secondary | ICD-10-CM

## 2019-11-23 DIAGNOSIS — Q8719 Other congenital malformation syndromes predominantly associated with short stature: Secondary | ICD-10-CM

## 2019-11-23 DIAGNOSIS — K130 Diseases of lips: Secondary | ICD-10-CM | POA: Insufficient documentation

## 2019-11-23 DIAGNOSIS — F79 Unspecified intellectual disabilities: Secondary | ICD-10-CM

## 2019-11-23 LAB — POCT URINALYSIS DIP (PROADVANTAGE DEVICE)
Bilirubin, UA: NEGATIVE
Blood, UA: NEGATIVE
Glucose, UA: NEGATIVE mg/dL
Ketones, POC UA: NEGATIVE mg/dL
Leukocytes, UA: NEGATIVE
Nitrite, UA: NEGATIVE
Protein Ur, POC: NEGATIVE mg/dL
Specific Gravity, Urine: 1.015
Urobilinogen, Ur: NEGATIVE
pH, UA: 7 (ref 5.0–8.0)

## 2019-11-23 NOTE — Progress Notes (Signed)
Subjective: Chief Complaint  Patient presents with  . Back Pain  . Neck Pain  . Dysphagia    wants swallowing test done    Here with mother today.  Dana Mcneil has a complex history of mental health issues, recent self injures behavior, head hitting against the wall, acting out throwing herself on the ground and into things, and she has been in and out of hospital evaluation in the last few months.  Mother and father has a difficult time taking care of her.  Mom is by her side nonstop now given her behavioral issues and overall physical concerns.  They have gotten accepted to [facility] facility but they are still waiting on bed placement so there is a delay in her getting in there.  She also has her gastroenterology appointment scheduled but this is not for couple months away  She is currently still seeing Dr. Betti Cruz, psychiatry here in [place] who is managing her mental health medicines for now.   her mental health medicines are very sedating.  Thus she spends a lot of the day in the bed.  Because of the sedation mom is afraid to let her walk around.  She recently fell down the stairs in her house.  Her bedroom is upstairs but she fell coming down the stairs.  She only fell a few stairs but she has been complaining of back pain ever since  She sleeps with the heat pad during the day some.  She is not getting much exercise right now due to the sedating effects of her medication  She continues to have swallowing problems both liquids and solids.  Mom notes that she walks with her head down most the time and thinks this contributes to her neck pain  She apparently acted out and threw herself on the floor several weeks ago when she was with her respite care.  Mother was informed after the fact that she had busted her lip had an injury to her lip.  She was not taking for evaluation at that time and no stitches were placed.  Mother does not think her lip looks like it healed properly.  This injury was 2  months ago    Objective: BP 110/80   Pulse 70   Temp 98 F (36.7 C)   Ht 4' 11.5" (1.511 m)   Wt 137 lb 12.8 oz (62.5 kg)   SpO2 97%   BMI 27.37 kg/m   Gen: wd, wn, nad Neck nontender to palpation, seemingly normal range of motion without deformity Back: No obvious tenderness on exam, she does not seem to have pain with range of motion which seems relatively full, there is asymmetry of the creases in the low back  suggesting scoliosis Legs nontender, normal range of motion without the form Lower extremity pulses sensation and strength and DTRs normal Normal heel and toe walk without pain Negative straight leg raise No oral lesion obvious, tonsils within normal, no erythema no thrush Neck supple nontender no lymphadenopathy or mass She does respond to questions, pleasant and smiling today Upper lip normal-appearing.  Her right lower lip has a lighter color flesh color asymmetry along the vermilion border, but no open wound     Assessment: Encounter Diagnoses  Name Primary?  . Acute low back pain without sciatica, unspecified back pain laterality Yes  . Neck pain   . High risk medication use   . Dysphagia, unspecified type   . Fall, initial encounter   . [name]   .  Intellectual disability   . Lip lesion      Plan: Back pain-go for x-rays, suspect some scoliosis.  Likely some of her pain is just from immobility and not moving around a lot during the day.  We discussed trying to get some walking and stretching daily.  Consider physical therapy to help  Neck pain-reviewed CT neck from May 2021 with no significant findings  Dysphagia, [name], intellectual disability-she has follow-up with GI but the appointment is still several months away.  We will schedule for updated swallow study  Falls, Cornelia de Lange, intellectual disability, mental health concerns-follow-up with psychiatry  Lip lesion-we discussed her recent fall that  was not witnessed by family.  She was not taken for evaluation of the wound apparently.  We discussed exam findings today    Cari was seen today for back pain, neck pain and dysphagia.  Diagnoses and all orders for this visit:  Acute low back pain without sciatica, unspecified back pain laterality -     POCT Urinalysis DIP (Proadvantage Device) -     DG Thoracic Spine 2 View; Future -     DG Lumbar Spine Complete; Future  Neck pain  High risk medication use  Dysphagia, unspecified type -     SLP modified barium swallow; Future -     DG Swallowing Func-Speech Pathology; Future  Fall, initial encounter -     DG Thoracic Spine 2 View; Future -     DG Lumbar Spine Complete; Future  [name] -     SLP modified barium swallow; Future -     DG Swallowing Func-Speech Pathology; Future  Intellectual disability -     SLP modified barium swallow; Future -     DG Swallowing Func-Speech Pathology; Future  Lip lesion   F/u pending xrays

## 2019-12-06 ENCOUNTER — Telehealth: Payer: Self-pay | Admitting: Medical

## 2019-12-06 ENCOUNTER — Other Ambulatory Visit: Payer: Self-pay

## 2019-12-06 ENCOUNTER — Ambulatory Visit (HOSPITAL_COMMUNITY)
Admission: RE | Admit: 2019-12-06 | Discharge: 2019-12-06 | Disposition: A | Discharge status: Home / Self Care | Payer: Medicaid Other | Source: Ambulatory Visit | Attending: Medical | Admitting: Medical

## 2019-12-06 ENCOUNTER — Other Ambulatory Visit: Payer: Self-pay | Admitting: Medical

## 2019-12-06 DIAGNOSIS — R1310 Dysphagia, unspecified: Secondary | ICD-10-CM | POA: Insufficient documentation

## 2019-12-06 DIAGNOSIS — Q8719 Other congenital malformation syndromes predominantly associated with short stature: Secondary | ICD-10-CM

## 2019-12-06 DIAGNOSIS — F79 Unspecified intellectual disabilities: Secondary | ICD-10-CM | POA: Insufficient documentation

## 2019-12-06 MED ORDER — DIAZEPAM 2.5 MG RE GEL: 2.5000 mg | Freq: Once | RECTAL | 1 refills | Status: DC

## 2019-12-06 MED ORDER — DIAZEPAM 2.5 MG RE GEL: 2.5000 mg | Freq: Once | RECTAL | 1 refills | Status: AC

## 2019-12-06 NOTE — Telephone Encounter (Signed)
Called & s/w pharmacist Selena Batten and she states nectar thickening is OTC & comes in packets of 80 and cost if $40 and insurance will not pay for it.  She will call mom & verify that it's ok to order this.  Also I had her check & see what pt was prescribed in past Diastat 2.5 mg gel insert once 1 pack, authorized this with 1 refill.

## 2019-12-06 NOTE — Therapy (Signed)
Modified Barium Swallow Progress Note  Patient Details  Name: Dana Mcneil MRN: [id number] Date of Birth: December 20, 1990  Today's Date: 12/06/2019  Modified Barium Swallow completed.  Full report located under Chart Review in the Imaging Section.  Brief recommendations include the following:  Clinical Impression  Dana Mcneil demonstrates an ongoing moderate pharyngeal dysphagia. Oral deficits were not visualized on this study, however, parent reports oral pocketing with every meal. Pharyngeal deficits included reduced pharyngeal constriction, BOT retraction, and epiglottic inversion, which result in consistent residue in the pyriform sinus, which is observed to spill onto vocal folds/into airway in trace amounts. Pt demonstrates no sensation/cough response to aspiration and has difficulty volitionally coughing/throat clearing (requires modeling). She previously was successful with preventing aspiration given use of chin tuck, but unfortunately, this strategy was noted to spill the pyriform sinus residue into the airway this date. Nectar thick liquids spilled less frequently compared to thin liquids with very trace amounts entering airway. Given cues, pt cleared airway with cough on nectar consistency and did not with thin liquids. Honey thick liquids eliminated penetration/aspiration, however, mother reports pt is very unlikely to use honey thick liquids. Esophageal phase revealed slow clearance of foods and mild retrograde movement of liquids. Recommend: nectar thick liquids in small sips, solids as tolerated (likely mech soft or chopped), alternate solids/liquids, clear throat intermittently, free water protocol (discussed with mother), diligent oral care BID, oral care after each meal to ensure clearance   Swallow Evaluation Recommendations   Recommended Consults: Consider GI evaluation;Consider esophageal assessment   SLP Diet Recommendations: Dysphagia 3 (Mech soft) solids;Nectar thick liquid;  free water protocol   Liquid Administration via: Cup;Straw   Medication Administration: Whole meds with puree   Supervision: Full supervision/cueing for compensatory strategies   Compensations: Slow rate;Small sips/bites;Clear throat intermittently   Postural Changes: Remain semi-upright after after feeds/meals (Comment)   Oral Care Recommendations: Oral care BID      Dana Mcneil P. Dana Mcneil, M.S., CCC-SLP Speech-Language Pathologist [facility] Pager: [phone]   Dana Mcneil Dana Mcneil 12/06/2019,3:09 PM

## 2019-12-06 NOTE — Telephone Encounter (Signed)
See message  Please call these into pharmacy.  I don't know how to order the nectar thickening through epic. Please call in the thickener.  I don't know what units this comes in or what the typical order/prescription would be.  For example, not sure if this is 1ml, 15ml, TID with meals??   Quantity per month?  Get pharmacist to help.  Please update chart accordingly if it can be put in system for future refills.     Similarly I have never prescribed the Diastat gel.   So not sure what the typical prescription would say, # per month.  I think this would be a daily use prn.  I will need pharmacist help on these as these are not typical prescriptions we write through the Epic system.

## 2019-12-06 NOTE — Telephone Encounter (Signed)
Mom called and states they did pt's swallowing test today and they recommended Nectar thickening and she needs Rx called into [facility] and also she still needs the Diastat said it has never been sent in for her seizures.

## 2019-12-08 ENCOUNTER — Other Ambulatory Visit: Payer: Self-pay | Admitting: Medical

## 2020-01-11 ENCOUNTER — Other Ambulatory Visit: Payer: Self-pay

## 2020-06-02 IMAGING — CT CT HEAD WITHOUT CONTRAST
3 of 4 series · 15 of 47 positions shown, 18 images · non-contrast
Comparison: None.

CLINICAL DATA: Seizure activity.  Sarzamin Belete syndrome

EXAM:
CT HEAD WITHOUT CONTRAST
TECHNIQUE: Contiguous axial images were obtained from the base of the skull
through the vertex without intravenous contrast.

[Series 2: head wo · axial · 0.37mm/px · z∈[+1532,+1627]mm · 9 of 25 slices shown, 12 images]
[im 3/25  brain]
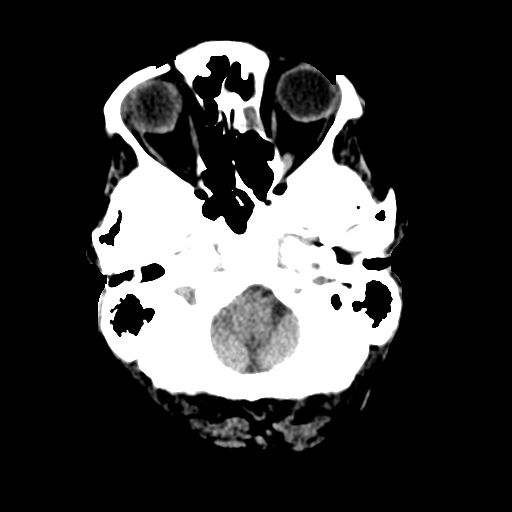
[im 3/25  bone]
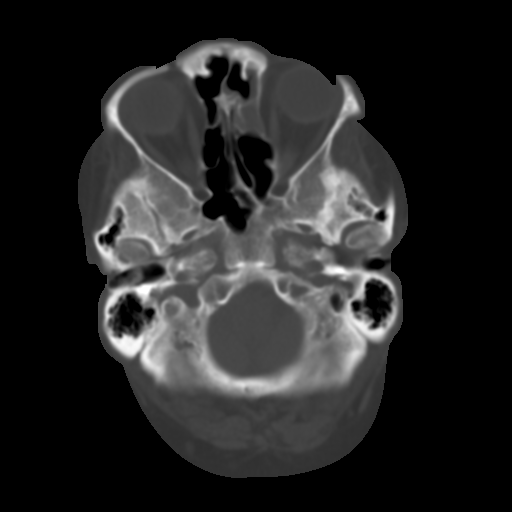
[im 5/25  brain]
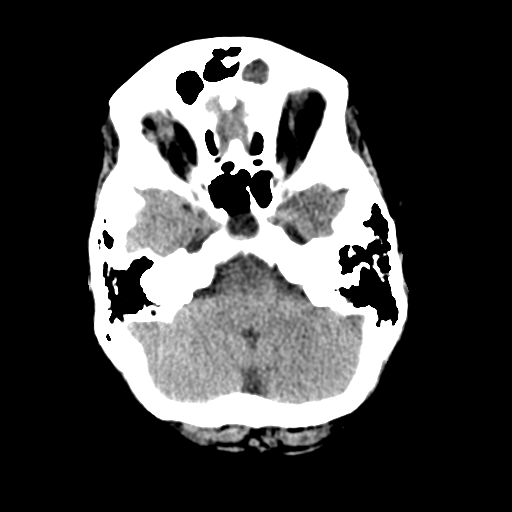
[im 8/25  brain]
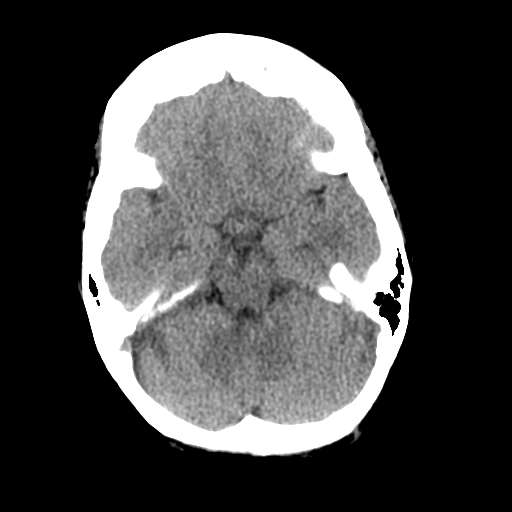
[im 10/25  brain]
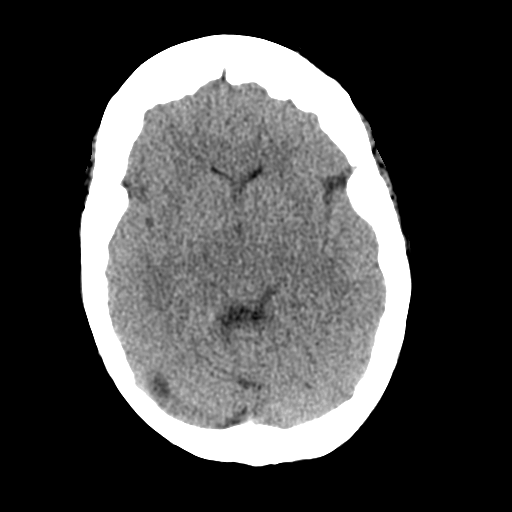
[im 13/25  brain]
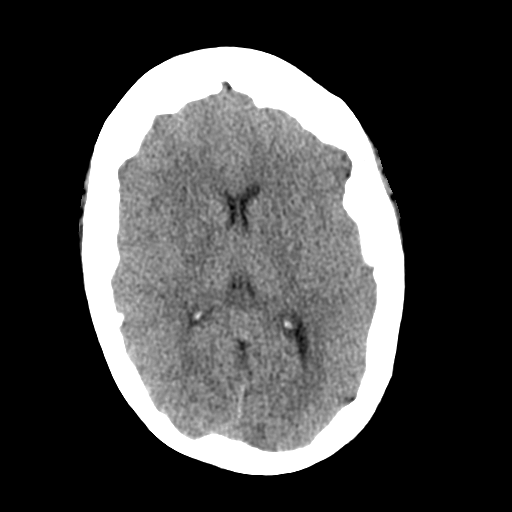
[im 13/25  bone]
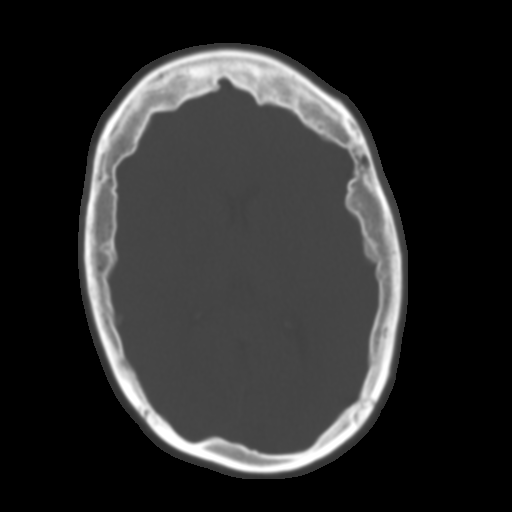
[im 15/25  brain]
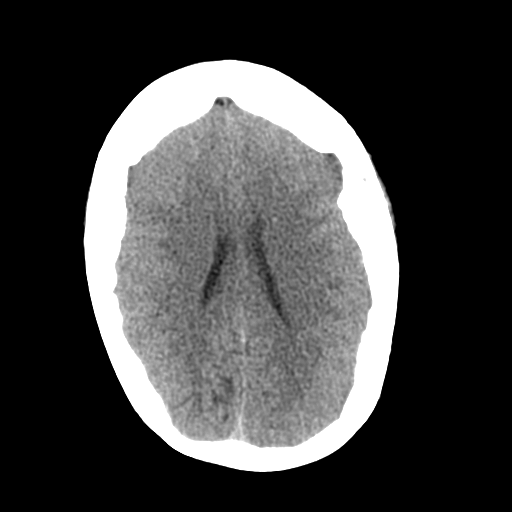
[im 17/25  brain]
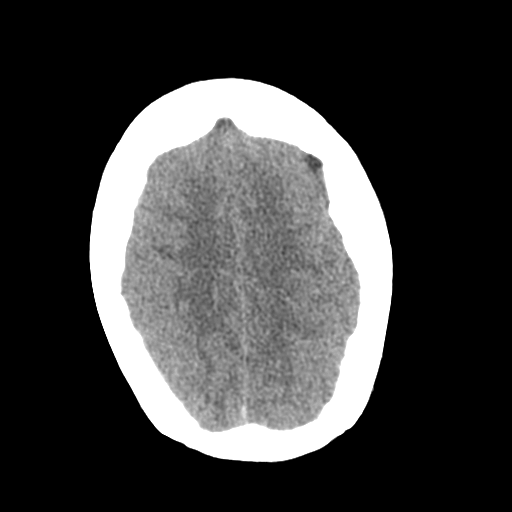
[im 20/25  brain]
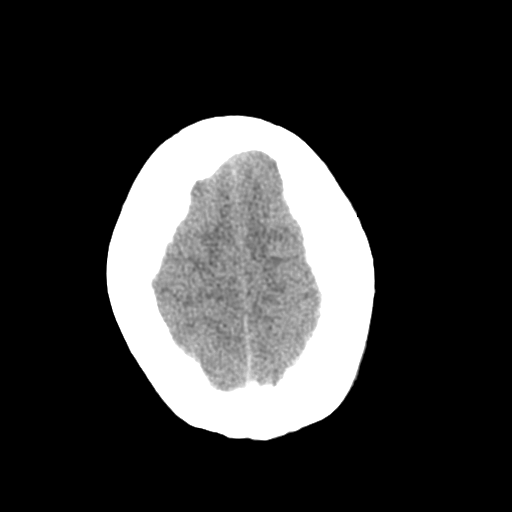
[im 22/25  brain]
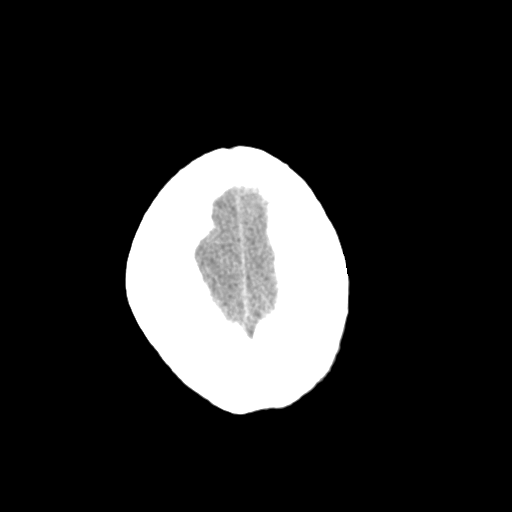
[im 22/25  bone]
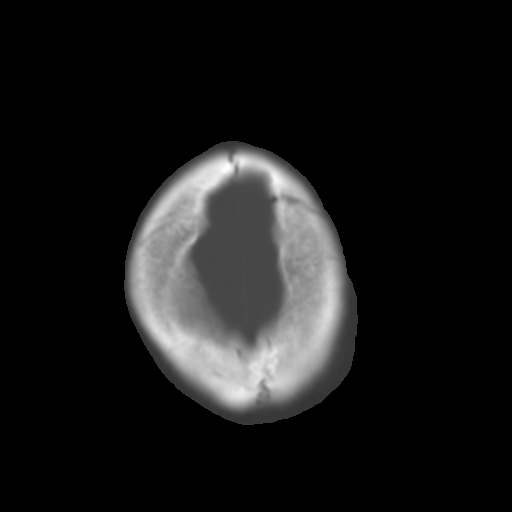

[Series 5: coronal soft tissue · coronal · 0.27mm/px · 3 of 63 slices shown]
[im 21/63  brain]
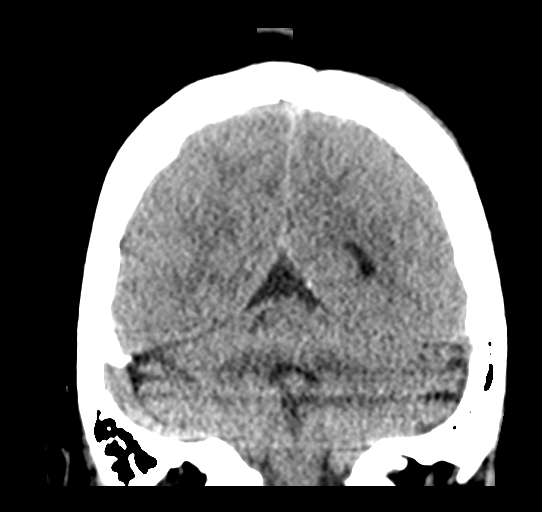
[im 28/63  brain]
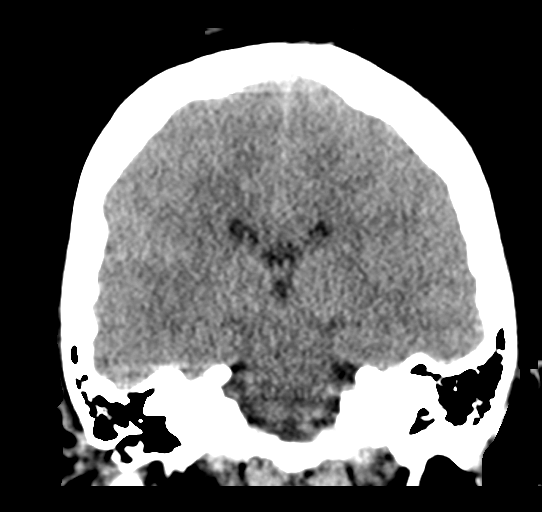
[im 35/63  brain]
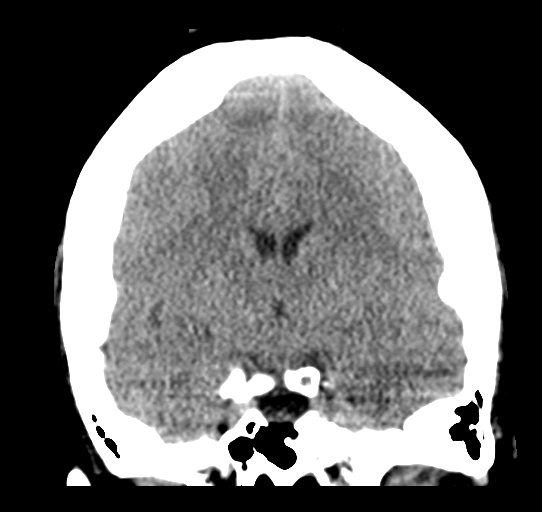

[Series 6: sagittal soft tissue · sagittal · 0.28mm/px · 3 of 47 slices shown]
[im 16/47  brain]
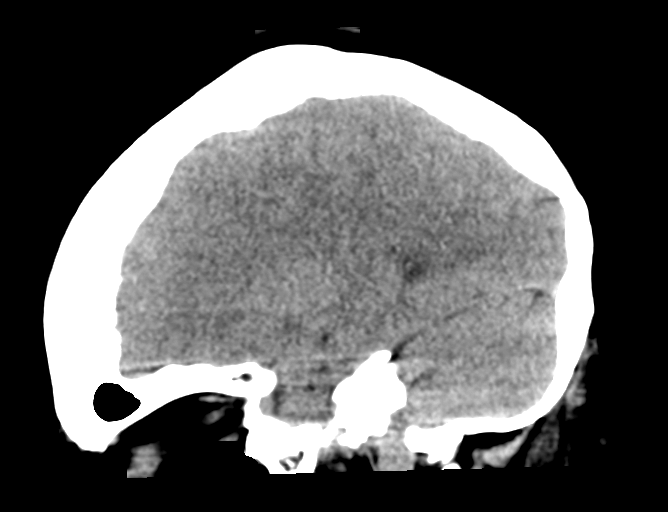
[im 24/47  brain]
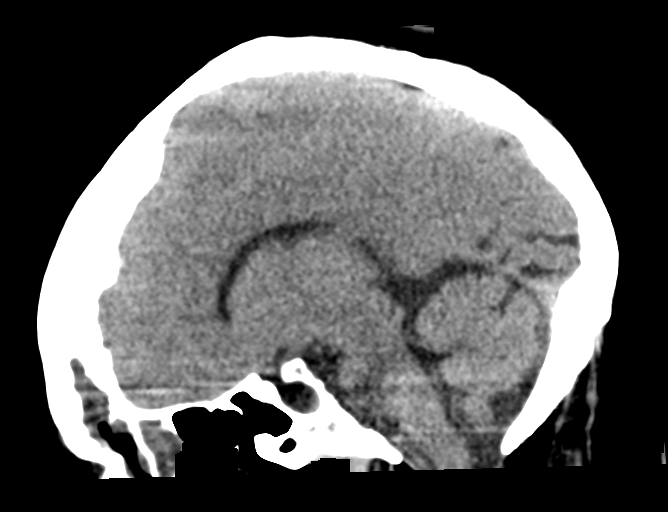
[im 31/47  brain]
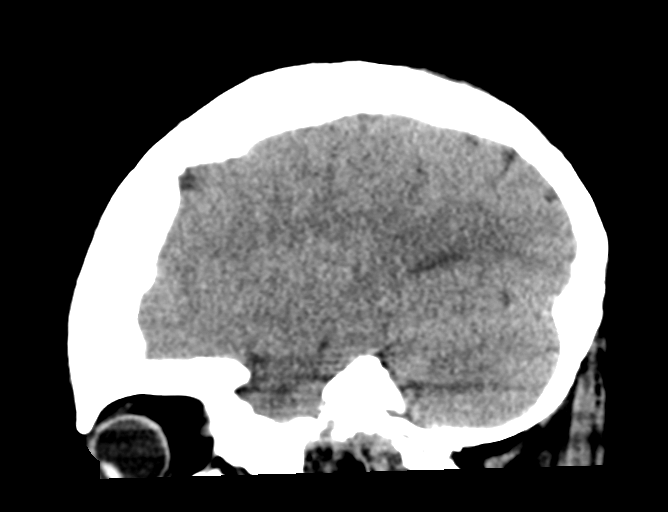

[15 of 47 positions shown; findings below may reference images not displayed]

FINDINGS: Brain: There is no mass, hemorrhage or extra-axial collection. The
ventricles are decreased in size relative to the prior study.
Gray-white differentiation is maintained.

Vascular: No abnormal hyperdensity of the major intracranial
arteries or dural venous sinuses. No intracranial atherosclerosis.

Skull: The visualized skull base, calvarium and extracranial soft
tissues are normal.

Sinuses/Orbits: The left frontal sinus, left maxillary sinus and
left anterior ethmoid air cells are opacified. No mastoid or middle
ear effusion. The orbits are normal.
IMPRESSION: No acute intracranial abnormality. Chronic left ostiomeatal complex
sinus disease.
